# Patient Record
Sex: Female | Born: 1970 | Race: Black or African American | Hispanic: No | Smoking: Current every day smoker
Health system: Southern US, Community
[De-identification: ages and names within clinical notes are randomized; demographics above are authoritative.]

## PROBLEM LIST (undated history)

## (undated) DIAGNOSIS — R7303 Prediabetes: Secondary | ICD-10-CM

## (undated) DIAGNOSIS — F419 Anxiety disorder, unspecified: Secondary | ICD-10-CM

## (undated) DIAGNOSIS — F32A Depression, unspecified: Secondary | ICD-10-CM

## (undated) DIAGNOSIS — K760 Fatty (change of) liver, not elsewhere classified: Secondary | ICD-10-CM

## (undated) DIAGNOSIS — E785 Hyperlipidemia, unspecified: Secondary | ICD-10-CM

## (undated) DIAGNOSIS — D649 Anemia, unspecified: Secondary | ICD-10-CM

## (undated) HISTORY — DX: Depression, unspecified: F32.A

## (undated) HISTORY — DX: Prediabetes: R73.03

## (undated) HISTORY — DX: Anxiety disorder, unspecified: F41.9

## (undated) HISTORY — PX: MULTIPLE TOOTH EXTRACTIONS: SHX2053

## (undated) HISTORY — DX: Fatty (change of) liver, not elsewhere classified: K76.0

## (undated) HISTORY — DX: Hyperlipidemia, unspecified: E78.5

## (undated) HISTORY — PX: OTHER SURGICAL HISTORY: SHX169

---

## 2003-01-22 ENCOUNTER — Emergency Department (HOSPITAL_COMMUNITY): Admission: EM | Admit: 2003-01-22 | Discharge: 2003-01-22 | Payer: Self-pay | Admitting: Emergency Medicine

## 2003-01-23 ENCOUNTER — Encounter: Payer: Self-pay | Admitting: Emergency Medicine

## 2003-08-11 ENCOUNTER — Emergency Department (HOSPITAL_COMMUNITY): Admission: EM | Admit: 2003-08-11 | Discharge: 2003-08-11 | Payer: Self-pay | Admitting: Emergency Medicine

## 2004-01-21 ENCOUNTER — Emergency Department (HOSPITAL_COMMUNITY): Admission: EM | Admit: 2004-01-21 | Discharge: 2004-01-21 | Payer: Self-pay | Admitting: Emergency Medicine

## 2004-02-08 ENCOUNTER — Other Ambulatory Visit: Admission: RE | Admit: 2004-02-08 | Discharge: 2004-02-08 | Payer: Self-pay | Admitting: Obstetrics and Gynecology

## 2004-03-25 ENCOUNTER — Inpatient Hospital Stay (HOSPITAL_COMMUNITY): Admission: AD | Admit: 2004-03-25 | Discharge: 2004-03-26 | Payer: Self-pay | Admitting: Obstetrics and Gynecology

## 2004-06-07 ENCOUNTER — Inpatient Hospital Stay (HOSPITAL_COMMUNITY): Admission: AD | Admit: 2004-06-07 | Discharge: 2004-06-10 | Payer: Self-pay | Admitting: Obstetrics and Gynecology

## 2004-12-30 ENCOUNTER — Emergency Department (HOSPITAL_COMMUNITY): Admission: EM | Admit: 2004-12-30 | Discharge: 2004-12-30 | Payer: Self-pay | Admitting: Emergency Medicine

## 2005-01-07 ENCOUNTER — Encounter (INDEPENDENT_AMBULATORY_CARE_PROVIDER_SITE_OTHER): Payer: Self-pay | Admitting: Specialist

## 2005-01-07 ENCOUNTER — Other Ambulatory Visit: Admission: RE | Admit: 2005-01-07 | Discharge: 2005-01-07 | Payer: Self-pay | Admitting: Diagnostic Radiology

## 2005-01-07 ENCOUNTER — Encounter: Admission: RE | Admit: 2005-01-07 | Discharge: 2005-01-07 | Payer: Self-pay | Admitting: Obstetrics and Gynecology

## 2005-01-21 ENCOUNTER — Encounter: Admission: RE | Admit: 2005-01-21 | Discharge: 2005-01-21 | Payer: Self-pay | Admitting: Obstetrics and Gynecology

## 2005-07-12 ENCOUNTER — Emergency Department (HOSPITAL_COMMUNITY): Admission: EM | Admit: 2005-07-12 | Discharge: 2005-07-12 | Payer: Self-pay | Admitting: *Deleted

## 2005-11-11 ENCOUNTER — Inpatient Hospital Stay (HOSPITAL_COMMUNITY): Admission: AD | Admit: 2005-11-11 | Discharge: 2005-11-11 | Payer: Self-pay | Admitting: Obstetrics and Gynecology

## 2006-05-17 ENCOUNTER — Emergency Department (HOSPITAL_COMMUNITY): Admission: EM | Admit: 2006-05-17 | Discharge: 2006-05-17 | Payer: Self-pay | Admitting: Emergency Medicine

## 2008-05-08 ENCOUNTER — Emergency Department (HOSPITAL_COMMUNITY): Admission: EM | Admit: 2008-05-08 | Discharge: 2008-05-08 | Payer: Self-pay | Admitting: Emergency Medicine

## 2008-08-21 ENCOUNTER — Emergency Department (HOSPITAL_COMMUNITY): Admission: EM | Admit: 2008-08-21 | Discharge: 2008-08-21 | Payer: Self-pay | Admitting: Family Medicine

## 2010-02-27 ENCOUNTER — Emergency Department (HOSPITAL_COMMUNITY): Admission: EM | Admit: 2010-02-27 | Discharge: 2010-02-27 | Payer: Self-pay | Admitting: Emergency Medicine

## 2010-11-16 LAB — POCT CARDIAC MARKERS
CKMB, poc: <1 ng/mL — ABNORMAL LOW (ref 1.0–8.0)
CKMB, poc: <1 ng/mL — ABNORMAL LOW (ref 1.0–8.0)
Myoglobin, poc: 41 ng/mL (ref 12–200)
Troponin i, poc: <0.05 ng/mL (ref 0.00–0.09)

## 2010-11-16 LAB — POCT I-STAT, CHEM 8
Creatinine, Ser: 0.8 mg/dL (ref 0.4–1.2)
Hemoglobin: 13.9 g/dL (ref 12.0–15.0)
Potassium: 4.1 mEq/L (ref 3.5–5.1)
Sodium: 140 mEq/L (ref 135–145)
TCO2: 26 mmol/L (ref 0–100)

## 2010-11-16 LAB — DIFFERENTIAL
Basophils Relative: 1 % (ref 0–1)
Eosinophils Absolute: 0 10*3/uL (ref 0.0–0.7)
Eosinophils Relative: 1 % (ref 0–5)
Lymphocytes Relative: 38 % (ref 12–46)
Monocytes Absolute: 0.4 10*3/uL (ref 0.1–1.0)
Neutrophils Relative %: 52 % (ref 43–77)

## 2010-11-16 LAB — CBC
Hemoglobin: 13.3 g/dL (ref 12.0–15.0)
MCH: 32.9 pg (ref 26.0–34.0)
MCHC: 34.2 g/dL (ref 30.0–36.0)
Platelets: 183 10*3/uL (ref 150–400)
RBC: 4.06 MIL/uL (ref 3.87–5.11)

## 2010-11-16 LAB — URINALYSIS, ROUTINE W REFLEX MICROSCOPIC
Bilirubin Urine: NEGATIVE
Glucose, UA: NEGATIVE mg/dL
Hgb urine dipstick: NEGATIVE
Ketones, ur: NEGATIVE mg/dL
Protein, ur: NEGATIVE mg/dL
pH: 7.5 (ref 5.0–8.0)

## 2010-11-16 LAB — D-DIMER, QUANTITATIVE: D-Dimer, Quant: <0.22 ug/mL-FEU (ref 0.00–0.48)

## 2011-01-16 NOTE — Discharge Summary (Signed)
[name], [name] NO.:  [id number]   MEDICAL RECORD NO.:  [id number]          PATIENT TYPE:  INP   LOCATION:  9120                          FACILITY:  [facility]   PHYSICIAN:  Malachi Pro. Ambrose Mantle, M.D. DATE OF BIRTH:  1971/03/31   DATE OF ADMISSION:  06/07/2004  DATE OF DISCHARGE:                                 DISCHARGE SUMMARY   HOSPITAL COURSE:  This is a 40 year old black female para 2-0-2-2 gravida 5  admitted with premature rupture of the membranes.  Blood group and type O  positive with a negative antibody, sickle cell negative, RPR nonreactive,  rubella immune, hepatitis B surface antigen negative, HIV declined, GC and  chlamydia negative, 1-hour Glucola 104, group B strep positive.  Ultrasound  on November 26, 2003:  Crown-rump length 3.58 cm; 10 weeks 4 days; [facility] June 19, 2004.  Nuchal thickening was noted.  The patient was referred to Dr.  Sherrie George at the [facility].  A CVS was performed, showed normal  chromosomes, and echocardiogram of the baby was normal.  Prenatal care was  otherwise uncomplicated.  The patient began leaking clear fluid at  approximately 3 a.m. on the day of admission.  She came to maternity  admission where during speculum exam by me, clear fluid was seen gushing  through the cervix.  Past medical history:  No known allergies, no  operations.  The patient did have rheumatic fever as a child.  Alcohol,  tobacco, and drugs:  None.  Family history:  Negative.  Obstetric history:  In February 1994 she delivered a 7-pound 1-ounce female vaginally.  In  November 1995, a 7-pound 1.5-ounce female vaginally.  In 2002 and 2003 she had  spontaneous abortions.  Physical exam on admission revealed normal vital  signs. Heart was normal with normal sinus rhythm and no murmurs.  Lungs were  clear to auscultation.  Abdomen soft, fundal height palpated near term.  Fetal heart tones were normal.  Clear fluid was in the vagina.  The cervix  was a  tight fingertip dilated, 30% effaced, vertex at a -3.  Admitting  impression is intrauterine pregnancy at 38+ weeks, premature rupture of the  membranes, history of increased nuchal fold thickness with normal  chromosomes and no apparent abnormality on ultrasound exam.  Pitocin was  begun.  The patient got up to 10 mU/minute when fetal heart rate  decelerated.  The Pitocin was stopped.  The Pitocin was begun again at 5  mU/minute.  Fetal heart rate decelerated again.  Pitocin was stopped again.  I could not be certain that the fetal heart rate decelerations the nurse had  stopped the Pitocin for were actual decelerations or whether they might have  just been occasions where the fetal heart rate signal was lost and maternal  rate was traced.  Pitocin was begun again.  Contractions were every 3-4  minutes.  Cervix fingertip, 50%.  By 11:15 p.m. the Pitocin was at 22  mU/minute.  Contractions every 3 minutes.  Cervix loose fingertip, 60%.  At  12:50 a.m. the Pitocin was at 24 mU/minute.  Contractions every  2 minutes.  Cervix 3-4 cm, 90%, vertex at a -2.  The patient requested an epidural but  the anesthesia service for whatever reason was unable to respond for over an  hour.  By 2:05 a.m. the cervix was 8 cm, 100%.  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  The patient finally received her epidural and became fully  dilated.  Deep variable decelerations persisted, so with a silver dollar of  caput visible with contractions, a Kiwi vacuum was placed and the baby was  delivered LOA over a first degree perineal laceration.  The female infant was  6 pounds 5 ounces, Apgars of 9 at one and 9 at five minutes, and the baby  looked to be normal.  Placenta was intact, uterus was normal.  Perineal  laceration repaired with 3-0 Vicryl.  Blood loss about 600 mL.  Postpartum,  the patient did quite well and was discharged on postpartum day #2.  Attention will be directed to her urethral diverticulum at her  6 weeks  checkup and a possible urologic referral will be made.  Initial hemoglobin  was 9.2; hematocrit 27.1; white count 5100; platelet count 227,000.  Follow-  up hemoglobin 7.6, hematocrit 22.4.  RPR nonreactive.  The patient  demonstrated no signs of postural hypotension and on postpartum day #2 was  ready for discharge.   FINAL DIAGNOSES:  1.  Intrauterine pregnancy at 38+ weeks, delivered vertex.  2.  History of increased nuchal thickness in the fetus, resolved, and      apparently unassociated with any abnormalities.  3.  Positive group B streptococcus.  4.  Urethral diverticulum.   OPERATIONS:  1.  Kiwi vacuum assisted vaginal delivery.  2.  Repair of perineal laceration.   FINAL CONDITION:  Improved.   Instructions include our regular discharge instruction booklet.  The patient  declines analgesics at discharge and is advised to return to the office in 6  weeks for follow-up examination.      TFH/MEDQ  D:  06/10/2004  T:  06/10/2004  Job:  [id number]   cc:   Particia Nearing, M.D.  [facility]  [place], [place]

## 2011-06-05 LAB — CULTURE, FUNGUS WITHOUT SMEAR

## 2011-06-05 LAB — KOH PREP: KOH Prep: NONE SEEN

## 2011-08-20 ENCOUNTER — Encounter: Payer: Self-pay | Admitting: *Deleted

## 2011-08-20 ENCOUNTER — Other Ambulatory Visit: Payer: Self-pay

## 2011-08-20 ENCOUNTER — Emergency Department (HOSPITAL_COMMUNITY)
Admission: EM | Admit: 2011-08-20 | Discharge: 2011-08-20 | Disposition: A | Discharge status: Home / Self Care | Payer: Self-pay | Attending: Emergency Medicine | Admitting: Emergency Medicine

## 2011-08-20 ENCOUNTER — Emergency Department (HOSPITAL_COMMUNITY): Payer: Self-pay

## 2011-08-20 DIAGNOSIS — R112 Nausea with vomiting, unspecified: Secondary | ICD-10-CM

## 2011-08-20 DIAGNOSIS — R5381 Other malaise: Secondary | ICD-10-CM | POA: Insufficient documentation

## 2011-08-20 DIAGNOSIS — IMO0001 Reserved for inherently not codable concepts without codable children: Secondary | ICD-10-CM | POA: Insufficient documentation

## 2011-08-20 DIAGNOSIS — R6883 Chills (without fever): Secondary | ICD-10-CM | POA: Insufficient documentation

## 2011-08-20 DIAGNOSIS — M791 Myalgia, unspecified site: Secondary | ICD-10-CM

## 2011-08-20 DIAGNOSIS — Z7982 Long term (current) use of aspirin: Secondary | ICD-10-CM | POA: Insufficient documentation

## 2011-08-20 DIAGNOSIS — R109 Unspecified abdominal pain: Secondary | ICD-10-CM | POA: Insufficient documentation

## 2011-08-20 DIAGNOSIS — R5383 Other fatigue: Secondary | ICD-10-CM

## 2011-08-20 HISTORY — DX: Anemia, unspecified: D64.9

## 2011-08-20 LAB — CBC
HCT: 39.6 % (ref 36.0–46.0)
Hemoglobin: 13.6 g/dL (ref 12.0–15.0)
MCH: 31.7 pg (ref 26.0–34.0)
MCV: 92.3 fL (ref 78.0–100.0)
RBC: 4.29 MIL/uL (ref 3.87–5.11)

## 2011-08-20 LAB — POCT I-STAT, CHEM 8
BUN: 19 mg/dL (ref 6–23)
Calcium, Ion: 1.24 mmol/L (ref 1.12–1.32)
Chloride: 104 mEq/L (ref 96–112)
Creatinine, Ser: 1 mg/dL (ref 0.50–1.10)
Sodium: 142 mEq/L (ref 135–145)

## 2011-08-20 LAB — DIFFERENTIAL
Eosinophils Absolute: 0.1 10*3/uL (ref 0.0–0.7)
Lymphs Abs: 2.6 10*3/uL (ref 0.7–4.0)
Monocytes Absolute: 0.7 10*3/uL (ref 0.1–1.0)
Monocytes Relative: 11 % (ref 3–12)
Neutrophils Relative %: 45 % (ref 43–77)

## 2011-08-20 LAB — POCT I-STAT TROPONIN I

## 2011-08-20 NOTE — ED Notes (Signed)
Pt remains in triage. No distress noted.  Pt sitting with family in the waiting room.  Ambulatory.

## 2011-08-20 NOTE — ED Notes (Signed)
Pt signed AMA form per PA's request due to not wanting to stay for repeat cardiac emzymes.  Pt st's she will follow up with her MD.

## 2011-08-20 NOTE — ED Provider Notes (Signed)
History     CSN: [id number]  Arrival date & time 08/20/11  1605   First MD Initiated Contact with Patient 08/20/11 2000      Chief Complaint  Patient presents with  . Nausea  . Generalized Body Aches     HPI  History provided by the patient. Patient is a 40 year old female with past history of some anemia who presents today with complaints of generalized fatigue, nausea, body aches with one episode of vomiting that began over the past 2-3 days. Today patient states that she also feels a fullness or lump in her throat and chest area with swallowing. She states that she's had some dry heaves with burning taste in the mouth.  Patient denies any alleviating or aggravating factors. Patient has tolerated by mouth fluids. patient denies any shortness of breath, diaphoresis, fever, chills, diarrhea, abdominal pain. She does report a family history of hypertension and coronary artery disease. Denies any family with early cardiac death. Patient has no other significant past medical history.    Past Medical History  Diagnosis Date  . Anemia     History reviewed. No pertinent past surgical history.  History reviewed. No pertinent family history.  History  Substance Use Topics  . Smoking status: Not on file  . Smokeless tobacco: Not on file  . Alcohol Use:     OB History    Grav Para Term Preterm Abortions TAB SAB Ect Mult Living                  Review of Systems  Constitutional: Positive for chills and fatigue. Negative for fever.  Respiratory: Negative for cough and shortness of breath.   Gastrointestinal: Positive for nausea and vomiting. Negative for abdominal pain and diarrhea.  Musculoskeletal: Positive for myalgias.  All other systems reviewed and are negative.    Allergies  Review of patient's allergies indicates no known allergies.  Home Medications   Current Outpatient Rx  Name Route Sig Dispense Refill  . ASPIRIN 325 MG PO TABS Oral Take 650 mg by mouth  daily.        BP 94/65  Pulse 84  Temp(Src) 98.4 F (36.9 C) (Oral)  Resp 16  SpO2 99%  Physical Exam  Nursing note and vitals reviewed. Constitutional: She is oriented to person, place, and time. She appears well-developed and well-nourished. No distress.  HENT:  Head: Normocephalic.  Mouth/Throat: Oropharynx is clear and moist.  Neck: Normal range of motion.  Cardiovascular: Normal rate, regular rhythm and normal heart sounds.   Pulmonary/Chest: Effort normal and breath sounds normal. She has no wheezes. She has no rales.  Abdominal: Soft. There is tenderness in the right upper quadrant and epigastric area. There is no rebound, no guarding, no CVA tenderness, no tenderness at McBurney's point and negative Murphy's sign.       Tenderness is mild  Musculoskeletal: She exhibits no edema and no tenderness.  Neurological: She is alert and oriented to person, place, and time.  Skin: Skin is warm. No rash noted.  Psychiatric: She has a normal mood and affect. Her behavior is normal.    ED Course  Procedures (including critical care time)  Labs Reviewed  POCT I-STAT, CHEM 8 - Abnormal; Notable for the following:    Glucose, Bld 112 (*)    All other components within normal limits  CBC  DIFFERENTIAL  POCT I-STAT TROPONIN I  I-STAT TROPONIN I  I-STAT, CHEM 8   Results for orders placed during the  hospital encounter of 08/20/11  CBC      Component Value Range   WBC 6.1  4.0 - 10.5 (K/uL)   RBC 4.29  3.87 - 5.11 (MIL/uL)   Hemoglobin 13.6  12.0 - 15.0 (g/dL)   HCT 16.1  09.6 - 04.5 (%)   MCV 92.3  78.0 - 100.0 (fL)   MCH 31.7  26.0 - 34.0 (pg)   MCHC 34.3  30.0 - 36.0 (g/dL)   RDW 40.9  81.1 - 91.4 (%)   Platelets 207  150 - 400 (K/uL)  DIFFERENTIAL      Component Value Range   Neutrophils Relative 45  43 - 77 (%)   Neutro Abs 2.7  1.7 - 7.7 (K/uL)   Lymphocytes Relative 43  12 - 46 (%)   Lymphs Abs 2.6  0.7 - 4.0 (K/uL)   Monocytes Relative 11  3 - 12 (%)   Monocytes  Absolute 0.7  0.1 - 1.0 (K/uL)   Eosinophils Relative 1  0 - 5 (%)   Eosinophils Absolute 0.1  0.0 - 0.7 (K/uL)   Basophils Relative 0  0 - 1 (%)   Basophils Absolute 0.0  0.0 - 0.1 (K/uL)  POCT I-STAT TROPONIN I      Component Value Range   Troponin i, poc 0.00  0.00 - 0.08 (ng/mL)   Comment 3           POCT I-STAT, CHEM 8      Component Value Range   Sodium 142  135 - 145 (mEq/L)   Potassium 3.8  3.5 - 5.1 (mEq/L)   Chloride 104  96 - 112 (mEq/L)   BUN 19  6 - 23 (mg/dL)   Creatinine, Ser 7.82  0.50 - 1.10 (mg/dL)   Glucose, Bld 956 (*) 70 - 99 (mg/dL)   Calcium, Ion 2.13  0.86 - 1.32 (mmol/L)   TCO2 29  0 - 100 (mmol/L)   Hemoglobin 14.3  12.0 - 15.0 (g/dL)   HCT 57.8  46.9 - 62.9 (%)     Dg Chest 2 View  08/20/2011  *RADIOLOGY REPORT*  Clinical Data: Chest pain, shortness of breath  CHEST - 2 VIEW  Comparison: 02/27/2010  Findings: Normal heart size, mediastinal contours, and pulmonary vascularity. Lungs clear. No pleural effusion or pneumothorax. Bones unremarkable.  IMPRESSION: No acute abnormalities.  Original Report Authenticated By: Lollie Marrow, M.D.     1. Nausea & vomiting   2. Myalgia   3. Fatigue       MDM  8:00 PM patient seen and evaluated. Patient in no acute distress. Patient states that she's ready to return home. she states that she is articulate long time and had all her tests.  Patient's symptoms seem atypical for ACS. Patient has risk factors of smoking and some family history for hypertension and coronary artery disease. I recommended the patient a three-hour cardiac check and she has refused further testing. Patient states she wishes to followup with her primary care provider. Patient's symptoms most likely reflect a viral process however she was warned of alternative concerning causes. Patient is PERC negative. No recent long travel, no history of recent surgery, no estrogen use for birth control, no hemoptysis, no shortness of breath, no history of  cancer.    Date: 08/20/2011  Rate: 82   Rhythm: normal sinus rhythm  QRS Axis: normal  Intervals: normal  ST/T Wave abnormalities: normal  Conduction Disutrbances:none  Narrative Interpretation: Possible Anterior infarct, age undetermined  Old  EKG Reviewed: unchanged from 02/27/2010         Angus Seller, PA 08/20/11 2047

## 2011-08-20 NOTE — ED Notes (Signed)
To ed for eval of nausea and body aches for the past few days. States she feels like she has 'food caught in my chest'. Pain with breathing. Nothing makes pain worse or better.

## 2011-08-23 NOTE — ED Provider Notes (Signed)
Medical screening examination/treatment/procedure(s) were performed by non-physician practitioner and as supervising physician I was immediately available for consultation/collaboration.   Dione Booze, MD 08/23/11 [phone]

## 2012-07-07 ENCOUNTER — Emergency Department (HOSPITAL_COMMUNITY)
Admission: EM | Admit: 2012-07-07 | Discharge: 2012-07-07 | Disposition: A | Discharge status: Home / Self Care | Payer: Self-pay | Attending: Emergency Medicine | Admitting: Emergency Medicine

## 2012-07-07 DIAGNOSIS — R21 Rash and other nonspecific skin eruption: Secondary | ICD-10-CM | POA: Insufficient documentation

## 2012-07-07 DIAGNOSIS — Z862 Personal history of diseases of the blood and blood-forming organs and certain disorders involving the immune mechanism: Secondary | ICD-10-CM | POA: Insufficient documentation

## 2012-07-07 MED ORDER — CEPHALEXIN 500 MG PO CAPS: 500.0000 mg | ORAL_CAPSULE | Freq: Four times a day (QID) | ORAL | Status: DC

## 2012-07-07 NOTE — Progress Notes (Signed)
Pt listed as self pay with no insurance coverage Pt confirms She is self pay [place] resident.  CM and [facility] coordinator spoke with her Pt offered [facility] services to assist with finding a [place] pay provider and health reform information Pt states she has insurance and see a provider " wayne smith" CM unable to locate pcp in provider list [facility] files indicates pt rolled off medicaid in 2012

## 2012-07-07 NOTE — ED Notes (Signed)
Pt states she has a rash from some jewelry. Pt states she scratched area and now it's infected. Pt has red excoriated area to L side of neck. Pt also has crusted over wound on L ear lobe. Pt states she has treated areas with anti-fungal creams and then bacitracin. Pt states she believes areas are infected. Pt states areas are very itchy. Pt with no acute distress.

## 2012-07-07 NOTE — ED Provider Notes (Signed)
History     CSN: [id number]  Arrival date & time 07/07/12  1439   First MD Initiated Contact with Patient 07/07/12 1539      Chief Complaint  Patient presents with  . Rash    (Consider location/radiation/quality/duration/timing/severity/associated sxs/prior treatment) Patient is a 41 y.o. female presenting with rash. The history is provided by the patient.  Rash  This is a new problem. Episode onset: 1 week ago  The problem has been gradually worsening. The problem is associated with an unknown factor. There has been no fever. Affected Location: left neck & left inferior lobe. The pain is at a severity of 0/10. The patient is experiencing no pain. Associated symptoms include itching. Pertinent negatives include no blisters and no pain. She has tried antibiotic cream, antihistamines and a cold compress for the symptoms. The treatment provided mild relief. Risk factors include new medications.    Past Medical History  Diagnosis Date  . Anemia     No past surgical history on file.  No family history on file.  History  Substance Use Topics  . Smoking status: Not on file  . Smokeless tobacco: Not on file  . Alcohol Use:     OB History    Grav Para Term Preterm Abortions TAB SAB Ect Mult Living                  Review of Systems  Constitutional: Negative for fever, diaphoresis and activity change.  HENT: Negative for congestion and neck pain.   Respiratory: Negative for cough.   Genitourinary: Negative for dysuria.  Musculoskeletal: Negative for myalgias.  Skin: Positive for itching and rash. Negative for color change and wound.  Neurological: Negative for headaches.  All other systems reviewed and are negative.    Allergies  Review of patient's allergies indicates no known allergies.  Home Medications  No current outpatient prescriptions on file.  BP 110/67  Pulse 87  Temp 98.3 F (36.8 C) (Oral)  Resp 16  SpO2 97%  Physical Exam  Nursing note and vitals  reviewed. Constitutional: She is oriented to person, place, and time. She appears well-developed and well-nourished. No distress.  HENT:  Head: Normocephalic and atraumatic.       No mastoid erythema, tragal tenderness or spread of rash to ear canal. TM normal.   Eyes: Conjunctivae normal and EOM are normal.  Neck: Normal range of motion.  Pulmonary/Chest: Effort normal.  Musculoskeletal: Normal range of motion.  Neurological: She is alert and oriented to person, place, and time.  Skin: Skin is warm and dry. Rash noted. She is not diaphoretic.       Left neckline crease with raw erythematous. Anterior and posterior ear lobe with crusting and flaking. No target lesions, weeping, draining, vesicles.   Psychiatric: She has a normal mood and affect. Her behavior is normal.    ED Course  Procedures (including critical care time)  Labs Reviewed - No data to display No results found.   No diagnosis found.    MDM  Rash  Patient presents to ER with complaint of rash onset approximately a week and a half ago.  No history of eczema or psoriasis.  Left neck crease appears raw and left earlobe with crusting.  No improvement with antifungal creams or bacitracin.  Other than pruritus there are no symptoms including pain or fever.  Discussed need to followup with dermatology.  Will treat with oral antibiotic.  Patient seen with Dr. Juleen China who agrees with this  plan.    Jaci Carrel, PA-C 07/07/12 1635

## 2012-07-13 NOTE — ED Provider Notes (Signed)
Medical screening examination/treatment/procedure(s) were performed by non-physician practitioner and as supervising physician I was immediately available for consultation/collaboration.  Johnice Riebe, MD 07/13/12 0052 

## 2012-07-18 ENCOUNTER — Encounter (HOSPITAL_COMMUNITY): Payer: Self-pay | Admitting: *Deleted

## 2012-07-18 ENCOUNTER — Emergency Department (HOSPITAL_COMMUNITY)
Admission: EM | Admit: 2012-07-18 | Discharge: 2012-07-18 | Disposition: A | Discharge status: Home / Self Care | Payer: Self-pay | Attending: Emergency Medicine | Admitting: Emergency Medicine

## 2012-07-18 DIAGNOSIS — Z862 Personal history of diseases of the blood and blood-forming organs and certain disorders involving the immune mechanism: Secondary | ICD-10-CM | POA: Insufficient documentation

## 2012-07-18 DIAGNOSIS — L259 Unspecified contact dermatitis, unspecified cause: Secondary | ICD-10-CM | POA: Insufficient documentation

## 2012-07-18 DIAGNOSIS — F17200 Nicotine dependence, unspecified, uncomplicated: Secondary | ICD-10-CM | POA: Insufficient documentation

## 2012-07-18 MED ORDER — DEXAMETHASONE SODIUM PHOSPHATE 10 MG/ML IJ SOLN
10.0000 mg | Freq: Once | INTRAMUSCULAR | Status: AC
  Administered 2012-07-18: 10 mg via INTRAMUSCULAR
  Filled 2012-07-18: qty 1

## 2012-07-18 MED ORDER — PREDNISONE 50 MG PO TABS: 50.0000 mg | ORAL_TABLET | Freq: Every day | ORAL | Status: DC

## 2012-07-18 MED ORDER — HYDROXYZINE HCL 25 MG PO TABS: 25.0000 mg | ORAL_TABLET | Freq: Four times a day (QID) | ORAL | Status: DC

## 2012-07-18 NOTE — Progress Notes (Signed)
[facility] ED Cm noted no pcp listed Pt confirms pcp is [name] Updated EPIC

## 2012-07-18 NOTE — ED Notes (Signed)
Pt states started having a rash on the L side of her neck, small size, came to ED d/t it weeping fluid, was given Keflex, pt states it is not helping, rash has gotten bigger, covers L side of neck, pt states very itchy.

## 2012-07-18 NOTE — ED Provider Notes (Signed)
History     CSN: [id number]  Arrival date & time 07/18/12  1653   First MD Initiated Contact with Patient 07/18/12 1713      No chief complaint on file.   (Consider location/radiation/quality/duration/timing/severity/associated sxs/prior treatment) HPI The patient presents with rash to her L lateral neck and L earlobe. The patient was seen here previously for similar rash in the last week. The patient states that the Keflex did not seem to help the area. The place on her neck seems to be getting worse. The patient states that there is an extreme amount of itching associated. The patient states that she used Hydrocortisone cream on the area as well but seems to not be helping. The patient denies fever, weakness, Nausea, vomiting, headache, or visual changes.  Past Medical History  Diagnosis Date  . Anemia     History reviewed. No pertinent past surgical history.  History reviewed. No pertinent family history.  History  Substance Use Topics  . Smoking status: Current Every Day Smoker  . Smokeless tobacco: Never Used  . Alcohol Use: Yes    OB History    Grav Para Term Preterm Abortions TAB SAB Ect Mult Living                  Review of Systems All other systems negative except as documented in the HPI. All pertinent positives and negatives as reviewed in the HPI.   Allergies  Review of patient's allergies indicates no known allergies.  Home Medications   Current Outpatient Rx  Name  Route  Sig  Dispense  Refill  . CEPHALEXIN 500 MG PO CAPS   Oral   Take 500 mg by mouth 4 (four) times daily.           BP 116/74  Pulse 94  Temp 98.7 F (37.1 C) (Oral)  Resp 12  SpO2 98%  Physical Exam  Nursing note and vitals reviewed. Constitutional: She is oriented to person, place, and time. She appears well-developed and well-nourished. No distress.  HENT:  Head: Normocephalic and atraumatic.  Mouth/Throat: Oropharynx is clear and moist.  Neurological: She is alert  and oriented to person, place, and time.  Skin: Skin is warm and dry. Rash noted.       ED Course  Procedures (including critical care time)  Patient will be referred to dermatology and given steroids along with Benadryl.  Told to return here as needed.  MDM         Carlyle Dolly, PA-C 07/18/12 1758

## 2012-07-18 NOTE — ED Notes (Signed)
Pt complains of rash on her neck for 2 weeks. But antibiotics did dry it up but it is extremely itchy. Now pt states it is starting to weep. Pt said it started on her ear and has spread to a larger area on her neck.

## 2012-07-18 NOTE — ED Provider Notes (Signed)
Medical screening examination/treatment/procedure(s) were performed by non-physician practitioner and as supervising physician I was immediately available for consultation/collaboration.   Celene Kras, MD 07/18/12 [phone]

## 2012-09-13 ENCOUNTER — Emergency Department (HOSPITAL_BASED_OUTPATIENT_CLINIC_OR_DEPARTMENT_OTHER)
Admission: EM | Admit: 2012-09-13 | Discharge: 2012-09-13 | Disposition: A | Discharge status: Home / Self Care | Payer: Self-pay | Attending: Emergency Medicine | Admitting: Emergency Medicine

## 2012-09-13 ENCOUNTER — Encounter (HOSPITAL_BASED_OUTPATIENT_CLINIC_OR_DEPARTMENT_OTHER): Payer: Self-pay

## 2012-09-13 ENCOUNTER — Emergency Department (HOSPITAL_COMMUNITY)
Admission: EM | Admit: 2012-09-13 | Discharge: 2012-09-13 | Disposition: A | Discharge status: Home / Self Care | Payer: Self-pay

## 2012-09-13 DIAGNOSIS — F17200 Nicotine dependence, unspecified, uncomplicated: Secondary | ICD-10-CM | POA: Insufficient documentation

## 2012-09-13 DIAGNOSIS — R21 Rash and other nonspecific skin eruption: Secondary | ICD-10-CM | POA: Insufficient documentation

## 2012-09-13 DIAGNOSIS — Z862 Personal history of diseases of the blood and blood-forming organs and certain disorders involving the immune mechanism: Secondary | ICD-10-CM | POA: Insufficient documentation

## 2012-09-13 MED ORDER — PREDNISONE 10 MG PO TABS: ORAL_TABLET | ORAL | Status: DC

## 2012-09-13 NOTE — ED Provider Notes (Signed)
History     CSN: [id number]  Arrival date & time 09/13/12  7829   First MD Initiated Contact with Patient 09/13/12 1926      Chief Complaint  Patient presents with  . Rash    (Consider location/radiation/quality/duration/timing/severity/associated sxs/prior treatment) HPI Comments: Pt states that she has had the rash intermittently over the last couple of months:pt states that when she took 6 days of steroids it completely cleared up by now it has come back:pt denies any new exposure  Patient is a 42 y.o. female presenting with rash. The history is provided by the patient. No language interpreter was used.  Rash  This is a recurrent problem. The current episode started more than 1 week ago. The problem has been gradually worsening. The problem is associated with an unknown factor. There has been no fever. The rash is present on the neck. The patient is experiencing no pain. She has tried steriods for the symptoms. The treatment provided significant relief.    Past Medical History  Diagnosis Date  . Anemia     History reviewed. No pertinent past surgical history.  No family history on file.  History  Substance Use Topics  . Smoking status: Current Every Day Smoker  . Smokeless tobacco: Never Used  . Alcohol Use: Yes     Comment: occasional    OB History    Grav Para Term Preterm Abortions TAB SAB Ect Mult Living                  Review of Systems  Constitutional: Negative.   Respiratory: Negative.   Cardiovascular: Negative.   Skin: Positive for rash.    Allergies  Review of patient's allergies indicates no known allergies.  Home Medications   Current Outpatient Rx  Name  Route  Sig  Dispense  Refill  . PREDNISONE 10 MG PO TABS      6 tablets po day 1-2,5 tablets po day 3-4, 4 tablets po day 5-6, 3 tablet po day 7-8, 2 tablet day 9-10   40 tablet   0     BP 102/79  Pulse 87  Temp 98.2 F (36.8 C) (Oral)  Resp 16  Ht 5\' 5"  (1.651 m)  Wt 145 lb  (65.772 kg)  BMI 24.13 kg/m2  SpO2 100%  Physical Exam  Nursing note and vitals reviewed. Constitutional: She is oriented to person, place, and time. She appears well-developed and well-nourished.  Cardiovascular: Normal rate and regular rhythm.   Pulmonary/Chest: Effort normal and breath sounds normal.  Neurological: She is alert and oriented to person, place, and time.  Skin:       Pt has dried scaly area to the left neck and ear:no drainage noted from the area    ED Course  Procedures (including critical care time)  Labs Reviewed - No data to display No results found.   1. Rash       MDM  Discussed follow up with derm:treated with steroids:pt is also requesting referral to pcp        Teressa Lower, NP 09/13/12 2137

## 2012-09-13 NOTE — ED Notes (Signed)
NP at bedside.

## 2012-09-13 NOTE — ED Notes (Signed)
Rash on neck and face x 2 months ago.  She was at [facility], states wait was to long and she was told by staff to come to [facility].

## 2012-09-13 NOTE — ED Notes (Signed)
Pt left to go to [facility]

## 2012-09-14 NOTE — ED Provider Notes (Signed)
Medical screening examination/treatment/procedure(s) were performed by non-physician practitioner and as supervising physician I was immediately available for consultation/collaboration.   Carleene Cooper III, MD 09/14/12 [phone]

## 2013-03-27 ENCOUNTER — Emergency Department (HOSPITAL_BASED_OUTPATIENT_CLINIC_OR_DEPARTMENT_OTHER): Payer: BC Managed Care – PPO

## 2013-03-27 ENCOUNTER — Emergency Department (HOSPITAL_BASED_OUTPATIENT_CLINIC_OR_DEPARTMENT_OTHER)
Admission: EM | Admit: 2013-03-27 | Discharge: 2013-03-28 | Disposition: A | Discharge status: Home / Self Care | Payer: BC Managed Care – PPO | Attending: Emergency Medicine | Admitting: Emergency Medicine

## 2013-03-27 ENCOUNTER — Encounter (HOSPITAL_BASED_OUTPATIENT_CLINIC_OR_DEPARTMENT_OTHER): Payer: Self-pay | Admitting: *Deleted

## 2013-03-27 ENCOUNTER — Encounter (HOSPITAL_COMMUNITY): Payer: Self-pay | Admitting: *Deleted

## 2013-03-27 ENCOUNTER — Emergency Department (HOSPITAL_COMMUNITY)
Admission: EM | Admit: 2013-03-27 | Discharge: 2013-03-27 | Discharge status: Against Medical Advice | Payer: BC Managed Care – PPO | Attending: Emergency Medicine | Admitting: Emergency Medicine

## 2013-03-27 DIAGNOSIS — K2970 Gastritis, unspecified, without bleeding: Secondary | ICD-10-CM | POA: Insufficient documentation

## 2013-03-27 DIAGNOSIS — F17200 Nicotine dependence, unspecified, uncomplicated: Secondary | ICD-10-CM | POA: Insufficient documentation

## 2013-03-27 DIAGNOSIS — K2990 Gastroduodenitis, unspecified, without bleeding: Secondary | ICD-10-CM | POA: Insufficient documentation

## 2013-03-27 DIAGNOSIS — D649 Anemia, unspecified: Secondary | ICD-10-CM | POA: Insufficient documentation

## 2013-03-27 DIAGNOSIS — R638 Other symptoms and signs concerning food and fluid intake: Secondary | ICD-10-CM | POA: Insufficient documentation

## 2013-03-27 DIAGNOSIS — R21 Rash and other nonspecific skin eruption: Secondary | ICD-10-CM | POA: Insufficient documentation

## 2013-03-27 DIAGNOSIS — R109 Unspecified abdominal pain: Secondary | ICD-10-CM | POA: Insufficient documentation

## 2013-03-27 DIAGNOSIS — Z862 Personal history of diseases of the blood and blood-forming organs and certain disorders involving the immune mechanism: Secondary | ICD-10-CM | POA: Insufficient documentation

## 2013-03-27 DIAGNOSIS — R112 Nausea with vomiting, unspecified: Secondary | ICD-10-CM | POA: Insufficient documentation

## 2013-03-27 DIAGNOSIS — Z3202 Encounter for pregnancy test, result negative: Secondary | ICD-10-CM | POA: Insufficient documentation

## 2013-03-27 DIAGNOSIS — R1110 Vomiting, unspecified: Secondary | ICD-10-CM | POA: Insufficient documentation

## 2013-03-27 LAB — CBC WITH DIFFERENTIAL/PLATELET
Eosinophils Relative: 1 % (ref 0–5)
HCT: 40.9 % (ref 36.0–46.0)
Lymphocytes Relative: 40 % (ref 12–46)
Lymphs Abs: 2.6 10*3/uL (ref 0.7–4.0)
MCV: 89.9 fL (ref 78.0–100.0)
Monocytes Absolute: 0.5 10*3/uL (ref 0.1–1.0)
RBC: 4.55 MIL/uL (ref 3.87–5.11)
WBC: 6.4 10*3/uL (ref 4.0–10.5)

## 2013-03-27 LAB — URINE MICROSCOPIC-ADD ON

## 2013-03-27 LAB — PREGNANCY, URINE: Preg Test, Ur: NEGATIVE

## 2013-03-27 LAB — URINALYSIS, ROUTINE W REFLEX MICROSCOPIC
Bilirubin Urine: NEGATIVE
Glucose, UA: NEGATIVE mg/dL
Hgb urine dipstick: NEGATIVE
Specific Gravity, Urine: 1.027 (ref 1.005–1.030)
pH: 6.5 (ref 5.0–8.0)

## 2013-03-27 MED ORDER — DICYCLOMINE HCL 10 MG/ML IM SOLN
20.0000 mg | Freq: Once | INTRAMUSCULAR | Status: AC
  Administered 2013-03-27: 20 mg via INTRAMUSCULAR
  Filled 2013-03-27: qty 2

## 2013-03-27 MED ORDER — GI COCKTAIL ~~LOC~~
30.0000 mL | Freq: Once | ORAL | Status: AC
  Administered 2013-03-27: 30 mL via ORAL
  Filled 2013-03-27: qty 30

## 2013-03-27 NOTE — ED Notes (Signed)
This RN notified by registration that this pt choosing to leave and left.

## 2013-03-27 NOTE — ED Provider Notes (Signed)
CSN: [id number]     Arrival date & time 03/27/13  2230 History  This chart was scribed for [name] Smitty Cords, MD by Bennett Scrape, ED Scribe. This patient was seen in room MH09/MH09 and the patient's care was started at 11:34 PM.   First MD Initiated Contact with Patient 03/27/13 2324     Chief Complaint  Patient presents with  . Abdominal Pain    Patient is a 42 y.o. female presenting with abdominal pain. The history is provided by the patient. No language interpreter was used.  Abdominal Pain This is a new problem. The current episode started more than 2 days ago. The problem has not changed since onset.Associated symptoms include abdominal pain. Pertinent negatives include no chest pain. Nothing aggravates the symptoms. Nothing relieves the symptoms. Treatments tried: gas x, mylanta  The treatment provided mild relief.    HPI Comments: [name] is a 42 y.o. female who presents to the Emergency Department complaining of 3 days of intermittent LUQ abdominal pain described as a cramping sensation. She reports associated dry heaves and decreased appetite. She states that the symptoms originally felt like gas and she tried Gas x , Mylanta and "dipped my finger in baking soda" with mild improvement. She denies having a h/o GERD or feeling a burning, acid feeling in the posterior throat. She denies any recent falls, trauma or changes in exercise regimen. She denies diarrhea and urinary symptoms as associated symptoms.   Past Medical History  Diagnosis Date  . Anemia    History reviewed. No pertinent past surgical history. History reviewed. No pertinent family history. History  Substance Use Topics  . Smoking status: Current Every Day Smoker -- 1.00 packs/day    Types: Cigarettes  . Smokeless tobacco: Never Used  . Alcohol Use: Yes     Comment: occasional   No OB history provided.  Review of Systems  Constitutional: Positive for appetite change.  Cardiovascular:  Negative for chest pain.  Gastrointestinal: Positive for nausea, vomiting and abdominal pain. Negative for diarrhea.  Genitourinary: Negative for dysuria and urgency.  Skin: Positive for rash.  All other systems reviewed and are negative.    Allergies  Review of patient's allergies indicates no known allergies.  Home Medications   Current Outpatient Rx  Name  Route  Sig  Dispense  Refill  . predniSONE (DELTASONE) 10 MG tablet      6 tablets po day 1-2,5 tablets po day 3-4, 4 tablets po day 5-6, 3 tablet po day 7-8, 2 tablet day 9-10   40 tablet   0    Triage Vitals: BP 101/68  Pulse 92  Temp(Src) 98 F (36.7 C) (Oral)  Resp 16  Ht 5\' 4"  (1.626 m)  Wt 156 lb (70.761 kg)  BMI 26.76 kg/m2  SpO2 99%  LMP 03/20/2013  Physical Exam  Nursing note and vitals reviewed. Constitutional: She is oriented to person, place, and time. She appears well-developed and well-nourished. No distress.  HENT:  Head: Normocephalic and atraumatic.  Mouth/Throat: Oropharynx is clear and moist.  Eyes: Conjunctivae and EOM are normal. Pupils are equal, round, and reactive to light.  Sclera are clear  Neck: Normal range of motion. Neck supple. No tracheal deviation present.  Cardiovascular: Normal rate, regular rhythm, normal heart sounds and intact distal pulses.   No murmur heard. Pulmonary/Chest: Effort normal and breath sounds normal. No respiratory distress. She has no wheezes. She has no rales.  Abdominal: Soft. Bowel sounds are normal. She exhibits  no mass. There is no tenderness. There is no rebound and no guarding.  Gas pocket in the upper abdomen  Musculoskeletal: Normal range of motion. She exhibits no edema (no ankle swelling).  Lymphadenopathy:    She has no cervical adenopathy.  Neurological: She is alert and oriented to person, place, and time. No cranial nerve deficit.  Pt able to move both sets of fingers and toes  Skin: Skin is warm and dry. No rash (no hives ) noted.  No  erythroderma   Psychiatric: She has a normal mood and affect. Her behavior is normal.    ED Course   Procedures (including critical care time)  Medications  traMADol (ULTRAM) tablet 50 mg (not administered)  gi cocktail (Maalox,Lidocaine,Donnatal) (30 mLs Oral Given 03/27/13 2349)  dicyclomine (BENTYL) injection 20 mg (20 mg Intramuscular Given 03/27/13 2349)    DIAGNOSTIC STUDIES: Oxygen Saturation is 99% on room air, normal by my interpretation.    COORDINATION OF CARE: 11:37 PM-Advised pt to use ivory soap and benadryl to improve the rash. Discussed treatment plan which includes medications, x-ray of abdomen, CMP annd UA with pt at bedside and pt agreed to plan.   Labs Reviewed  URINALYSIS, ROUTINE W REFLEX MICROSCOPIC - Abnormal; Notable for the following:    Ketones, ur 15 (*)    Leukocytes, UA SMALL (*)    All other components within normal limits  COMPREHENSIVE METABOLIC PANEL - Abnormal; Notable for the following:    Glucose, Bld 114 (*)    Total Bilirubin 0.2 (*)    GFR calc non Af Amer 90 (*)    All other components within normal limits  URINE MICROSCOPIC-ADD ON - Abnormal; Notable for the following:    Squamous Epithelial / LPF FEW (*)    Bacteria, UA FEW (*)    All other components within normal limits  URINE CULTURE  PREGNANCY, URINE   Dg Abd Acute W/chest  03/28/2013   *RADIOLOGY REPORT*  Clinical Data: Pain.  Nausea for 3 days.  ACUTE ABDOMEN SERIES (ABDOMEN 2 VIEW & CHEST 1 VIEW)  Comparison: 08/20/2011.  Findings: Lungs clear.  Cardiopericardial silhouette within normal limits.  Trachea midline.  No airspace disease or effusion. Bowel gas pattern is within normal limits.  No pathologic air fluid levels are identified.   Stool and bowel gas present in the rectosigmoid.  IUD is present within the uterus.  Phleboliths noted.  IMPRESSION: No acute abnormality.  Normal bowel gas pattern.   Original Report Authenticated By: Andreas Newport, M.D.   1. Gastritis      MDM  Gastritis   I personally performed the services described in this documentation, which was scribed in my presence. The recorded information has been reviewed and is accurate.     Jasmine Awe, MD 03/28/13 [phone]

## 2013-03-27 NOTE — ED Notes (Signed)
Pt seen ay [facility] ED this pm labs drawn left AMA before seen by PMD , abd pain x 4 days

## 2013-03-27 NOTE — ED Notes (Signed)
Pt in c/o abd pain x2 days with vomiting, pt states pain is to upper abdomen and she feels like her abdomen is cramping when the pain comes on and pain radiates into her back

## 2013-03-28 LAB — COMPREHENSIVE METABOLIC PANEL
AST: 15 U/L (ref 0–37)
BUN: 12 mg/dL (ref 6–23)
CO2: 26 mEq/L (ref 19–32)
Calcium: 9.7 mg/dL (ref 8.4–10.5)
Chloride: 104 mEq/L (ref 96–112)
Creatinine, Ser: 0.8 mg/dL (ref 0.50–1.10)
GFR calc non Af Amer: 90 mL/min — ABNORMAL LOW (ref >90)
Total Bilirubin: 0.2 mg/dL — ABNORMAL LOW (ref 0.3–1.2)

## 2013-03-28 MED ORDER — TRAMADOL HCL 50 MG PO TABS
50.0000 mg | ORAL_TABLET | Freq: Once | ORAL | Status: DC
  Filled 2013-03-28: qty 1

## 2013-03-28 MED ORDER — TRAMADOL HCL 50 MG PO TABS: 50.0000 mg | ORAL_TABLET | Freq: Four times a day (QID) | ORAL | Status: DC | PRN

## 2013-03-28 MED ORDER — OMEPRAZOLE 20 MG PO CPDR: 20.0000 mg | DELAYED_RELEASE_CAPSULE | Freq: Every day | ORAL | Status: DC

## 2013-03-28 MED ORDER — SUCRALFATE 1 GM/10ML PO SUSP: 1.0000 g | Freq: Four times a day (QID) | ORAL | Status: DC

## 2013-03-29 LAB — URINE CULTURE: Colony Count: 15000

## 2014-02-07 ENCOUNTER — Encounter (HOSPITAL_COMMUNITY): Payer: Self-pay | Admitting: Emergency Medicine

## 2014-02-07 ENCOUNTER — Emergency Department (HOSPITAL_COMMUNITY)
Admission: EM | Admit: 2014-02-07 | Discharge: 2014-02-07 | Disposition: A | Discharge status: Home / Self Care | Payer: No Typology Code available for payment source | Attending: Emergency Medicine | Admitting: Emergency Medicine

## 2014-02-07 ENCOUNTER — Emergency Department (HOSPITAL_COMMUNITY): Payer: No Typology Code available for payment source

## 2014-02-07 DIAGNOSIS — F17200 Nicotine dependence, unspecified, uncomplicated: Secondary | ICD-10-CM | POA: Insufficient documentation

## 2014-02-07 DIAGNOSIS — R112 Nausea with vomiting, unspecified: Secondary | ICD-10-CM | POA: Insufficient documentation

## 2014-02-07 DIAGNOSIS — Z3202 Encounter for pregnancy test, result negative: Secondary | ICD-10-CM | POA: Insufficient documentation

## 2014-02-07 DIAGNOSIS — R0789 Other chest pain: Secondary | ICD-10-CM | POA: Insufficient documentation

## 2014-02-07 DIAGNOSIS — Z79899 Other long term (current) drug therapy: Secondary | ICD-10-CM | POA: Insufficient documentation

## 2014-02-07 DIAGNOSIS — D649 Anemia, unspecified: Secondary | ICD-10-CM | POA: Insufficient documentation

## 2014-02-07 LAB — I-STAT TROPONIN, ED: Troponin i, poc: 0.01 ng/mL (ref 0.00–0.08)

## 2014-02-07 LAB — BASIC METABOLIC PANEL
BUN: 10 mg/dL (ref 6–23)
CO2: 24 mEq/L (ref 19–32)
CREATININE: 0.59 mg/dL (ref 0.50–1.10)
Calcium: 9 mg/dL (ref 8.4–10.5)
Chloride: 103 mEq/L (ref 96–112)
Glucose, Bld: 90 mg/dL (ref 70–99)
Potassium: 4.2 mEq/L (ref 3.7–5.3)
Sodium: 139 mEq/L (ref 137–147)

## 2014-02-07 LAB — LIPASE, BLOOD: Lipase: 37 U/L (ref 11–59)

## 2014-02-07 LAB — HEPATIC FUNCTION PANEL
ALBUMIN: 3.8 g/dL (ref 3.5–5.2)
ALT: 15 U/L (ref 0–35)
AST: 15 U/L (ref 0–37)
Alkaline Phosphatase: 76 U/L (ref 39–117)
BILIRUBIN TOTAL: 0.2 mg/dL — AB (ref 0.3–1.2)
Total Protein: 7.5 g/dL (ref 6.0–8.3)

## 2014-02-07 LAB — CBC
HEMATOCRIT: 38.1 % (ref 36.0–46.0)
Hemoglobin: 13 g/dL (ref 12.0–15.0)
MCH: 31.5 pg (ref 26.0–34.0)
MCHC: 34.1 g/dL (ref 30.0–36.0)
MCV: 92.3 fL (ref 78.0–100.0)
Platelets: 180 10*3/uL (ref 150–400)
RBC: 4.13 MIL/uL (ref 3.87–5.11)
RDW: 14.5 % (ref 11.5–15.5)
WBC: 5.7 10*3/uL (ref 4.0–10.5)

## 2014-02-07 LAB — TROPONIN I
Troponin I: <0.3 ng/mL (ref <0.30)
Troponin I: <0.3 ng/mL (ref <0.30)

## 2014-02-07 LAB — POC URINE PREG, ED: PREG TEST UR: NEGATIVE

## 2014-02-07 LAB — D-DIMER, QUANTITATIVE: D-Dimer, Quant: 0.27 ug/mL-FEU (ref 0.00–0.48)

## 2014-02-07 MED ORDER — MORPHINE SULFATE 4 MG/ML IJ SOLN
4.0000 mg | Freq: Once | INTRAMUSCULAR | Status: DC
  Filled 2014-02-07: qty 1

## 2014-02-07 MED ORDER — IOHEXOL 350 MG/ML SOLN
100.0000 mL | Freq: Once | INTRAVENOUS | Status: AC | PRN
  Administered 2014-02-07: 100 mL via INTRAVENOUS

## 2014-02-07 MED ORDER — ASPIRIN 81 MG PO CHEW: 324.0000 mg | CHEWABLE_TABLET | Freq: Once | ORAL | Status: DC

## 2014-02-07 MED ORDER — KETOROLAC TROMETHAMINE 30 MG/ML IJ SOLN
30.0000 mg | Freq: Once | INTRAMUSCULAR | Status: AC
  Administered 2014-02-07: 30 mg via INTRAVENOUS
  Filled 2014-02-07: qty 1

## 2014-02-07 NOTE — Discharge Instructions (Signed)
Chest Pain (Nonspecific) There is no evidence of a heart attack or blood clot in the lung. Follow up with your doctor and the cardiology. Return to the ED if you develop new or worsening symptoms.  It is often hard to give a specific diagnosis for the cause of chest pain. There is always a chance that your pain could be related to something serious, such as a heart attack or a blood clot in the lungs. You need to follow up with your caregiver for further evaluation. CAUSES   Heartburn.  Pneumonia or bronchitis.  Anxiety or stress.  Inflammation around your heart (pericarditis) or lung (pleuritis or pleurisy).  A blood clot in the lung.  A collapsed lung (pneumothorax). It can develop suddenly on its own (spontaneous pneumothorax) or from injury (trauma) to the chest.  Shingles infection (herpes zoster virus). The chest wall is composed of bones, muscles, and cartilage. Any of these can be the source of the pain.  The bones can be bruised by injury.  The muscles or cartilage can be strained by coughing or overwork.  The cartilage can be affected by inflammation and become sore (costochondritis). DIAGNOSIS  Lab tests or other studies, such as X-rays, electrocardiography, stress testing, or cardiac imaging, may be needed to find the cause of your pain.  TREATMENT   Treatment depends on what may be causing your chest pain. Treatment may include:  Acid blockers for heartburn.  Anti-inflammatory medicine.  Pain medicine for inflammatory conditions.  Antibiotics if an infection is present.  You may be advised to change lifestyle habits. This includes stopping smoking and avoiding alcohol, caffeine, and chocolate.  You may be advised to keep your head raised (elevated) when sleeping. This reduces the chance of acid going backward from your stomach into your esophagus.  Most of the time, nonspecific chest pain will improve within 2 to 3 days with rest and mild pain medicine. HOME  CARE INSTRUCTIONS   If antibiotics were prescribed, take your antibiotics as directed. Finish them even if you start to feel better.  For the next few days, avoid physical activities that bring on chest pain. Continue physical activities as directed.  Do not smoke.  Avoid drinking alcohol.  Only take over-the-counter or prescription medicine for pain, discomfort, or fever as directed by your caregiver.  Follow your caregiver's suggestions for further testing if your chest pain does not go away.  Keep any follow-up appointments you made. If you do not go to an appointment, you could develop lasting (chronic) problems with pain. If there is any problem keeping an appointment, you must call to reschedule. SEEK MEDICAL CARE IF:   You think you are having problems from the medicine you are taking. Read your medicine instructions carefully.  Your chest pain does not go away, even after treatment.  You develop a rash with blisters on your chest. SEEK IMMEDIATE MEDICAL CARE IF:   You have increased chest pain or pain that spreads to your arm, neck, jaw, back, or abdomen.  You develop shortness of breath, an increasing cough, or you are coughing up blood.  You have severe back or abdominal pain, feel nauseous, or vomit.  You develop severe weakness, fainting, or chills.  You have a fever. THIS IS AN EMERGENCY. Do not wait to see if the pain will go away. Get medical help at once. Call your local emergency services (911 in [place] Do not drive yourself to the hospital. MAKE SURE YOU:   Understand these instructions.  Will watch your condition.  Will get help right away if you are not doing well or get worse. Document Released: 05/27/2005 Document Revised: 11/09/2011 Document Reviewed: 03/22/2008 [facility] Patient Information 2014 [place].

## 2014-02-07 NOTE — ED Provider Notes (Signed)
CSN: [id number]     Arrival date & time 02/07/14  1124 History   First MD Initiated Contact with Patient 02/07/14 1207     Chief Complaint  Patient presents with  . Chest Pain     (Consider location/radiation/quality/duration/timing/severity/associated sxs/prior Treatment) HPI Comments: Patient reports intermittent right-sided chest pain with nausea since this morning she woke up. The pain is sharp and stabbing on the right side of her chest lasting a few seconds at a time. She continues to have soreness in between the sharp episodes. The pain lasts about 30-45 seconds at a time and then improves but a constant "soreness" remains. Radiates to her right arm and right back. She denies any trauma or lifting injury. She denies any focal weakness, numbness or tingling. She is a previous smoker. She denies any cardiac or pulmonary history. She denies any medical problems. She's reports a negative stress test several years ago. She denies any leg pain or leg swelling. The pain is worse with certain movements and better with rest. Denies abdominal pain or vomiting.  The history is provided by the patient.    Past Medical History  Diagnosis Date  . Anemia    History reviewed. No pertinent past surgical history. History reviewed. No pertinent family history. History  Substance Use Topics  . Smoking status: Current Every Day Smoker -- 1.00 packs/day    Types: Cigarettes  . Smokeless tobacco: Never Used  . Alcohol Use: Yes     Comment: occasional   OB History   Grav Para Term Preterm Abortions TAB SAB Ect Mult Living                 Review of Systems  Constitutional: Negative for activity change.  HENT: Negative for congestion and rhinorrhea.   Respiratory: Negative for cough, chest tightness and shortness of breath.   Cardiovascular: Positive for chest pain.  Gastrointestinal: Positive for nausea. Negative for vomiting and abdominal pain.  Genitourinary: Negative for dysuria, hematuria,  vaginal bleeding and vaginal discharge.  Musculoskeletal: Negative for arthralgias, back pain and myalgias.  Skin: Negative for rash.  Neurological: Negative for dizziness, weakness and headaches.  A complete 10 system review of systems was obtained and all systems are negative except as noted in the HPI and PMH.      Allergies  Review of patient's allergies indicates no known allergies.  Home Medications   Prior to Admission medications   Medication Sig Start Date End Date Taking? Authorizing Provider  MELATONIN PO Take 1 tablet by mouth daily.   Yes Historical Provider, MD   BP 106/74  Pulse 75  Temp(Src) 98.2 F (36.8 C) (Oral)  Resp 15  SpO2 100% Physical Exam  Constitutional: She is oriented to person, place, and time. She appears well-developed. No distress.  HENT:  Head: Normocephalic and atraumatic.  Mouth/Throat: Oropharynx is clear and moist. No oropharyngeal exudate.  Eyes: Conjunctivae and EOM are normal. Pupils are equal, round, and reactive to light.  Neck: Normal range of motion. Neck supple.  Cardiovascular: Normal rate, regular rhythm, normal heart sounds and intact distal pulses.   No murmur heard. Pulmonary/Chest: Effort normal and breath sounds normal. No respiratory distress. She exhibits tenderness.  Right-sided chest tenderness that is worse with palpation or movement.  Abdominal: Soft. There is no tenderness. There is no rebound and no guarding.  Musculoskeletal: Normal range of motion. She exhibits no edema and no tenderness.  Neurological: She is alert and oriented to person, place, and time. No  cranial nerve deficit. She exhibits normal muscle tone. Coordination normal.  CN 2-12 intact, no ataxia on finger to nose, no nystagmus, 5/5 strength throughout, no pronator drift, Romberg negative, normal gait.   Skin: Skin is warm.    ED Course  Procedures (including critical care time) Labs Review Labs Reviewed  HEPATIC FUNCTION PANEL - Abnormal;  Notable for the following:    Total Bilirubin 0.2 (*)    All other components within normal limits  CBC  BASIC METABOLIC PANEL  TROPONIN I  D-DIMER, QUANTITATIVE  LIPASE, BLOOD  TROPONIN I  I-STAT TROPOININ, ED  POC URINE PREG, ED    Imaging Review Dg Chest 2 View  02/07/2014   CLINICAL DATA:  Chest pain  EXAM: CHEST  2 VIEW  COMPARISON:  03/28/2013  FINDINGS: The heart size and mediastinal contours are within normal limits. Both lungs are clear. The visualized skeletal structures are unremarkable.  IMPRESSION: No active cardiopulmonary disease.   Electronically Signed   By: Franchot Gallo M.D.   On: 02/07/2014 12:13   Ct Angio Chest Aortic Dissect W &/or W/o  02/07/2014   CLINICAL DATA:  Chest pain going through to back, question aortic dissection  EXAM: CT ANGIOGRAPHY CHEST, ABDOMEN AND PELVIS  TECHNIQUE: Multidetector CT imaging through the chest, abdomen and pelvis was performed using the standard protocol during bolus administration of intravenous contrast. Multiplanar reconstructed images and MIPs were obtained and reviewed to evaluate the vascular anatomy.  CONTRAST:  113mL OMNIPAQUE IOHEXOL 350 MG/ML SOLN IV; no oral contrast administered  COMPARISON:  None  FINDINGS: CTA CHEST FINDINGS  Pre contrast images demonstrate normal caliber thoracic aorta.  No intramural hematoma or high attenuation crescent seen within aortic wall.  Following contrast, normal aortic enhancement without aneurysm or dissection.  Pulmonary arteries patent without evidence of pulmonary embolism.  No thoracic adenopathy.  Lungs clear.  No infiltrate, pleural effusion, pneumothorax or acute osseous findings.  Review of the MIP images confirms the above findings.  CTA ABDOMEN AND PELVIS FINDINGS  Aorta normal caliber without aneurysm or dissection.  Major intra-abdominal vascular origins are patent.  Small umbilical hernia containing fat.  Liver, spleen, pancreas, kidneys, and adrenal glands normal appearance.  IUD  within uterus.  Normal appendix.  BILATERAL ovarian cysts, 2.7 x 2.6 x 2.4 cm on LEFT and 3.7 x 3.9 x 3.7 cm on RIGHT.  Large Nabothian cysts at cervix.  Bladder unremarkable.  Stomach and bowel loops normal appearance.  No mass, adenopathy, free fluid, inflammatory process, or acute osseous findings.  Review of the MIP images confirms the above findings.  IMPRESSION: No acute intra thoracic or intra-abdominal abnormalities.  Small umbilical hernia containing fat.  BILATERAL ovarian cysts.   Electronically Signed   By: Lavonia Dana M.D.   On: 02/07/2014 16:20   Ct Angio Abd/pel W/ And/or W/o  02/07/2014   CLINICAL DATA:  Chest pain going through to back, question aortic dissection  EXAM: CT ANGIOGRAPHY CHEST, ABDOMEN AND PELVIS  TECHNIQUE: Multidetector CT imaging through the chest, abdomen and pelvis was performed using the standard protocol during bolus administration of intravenous contrast. Multiplanar reconstructed images and MIPs were obtained and reviewed to evaluate the vascular anatomy.  CONTRAST:  155mL OMNIPAQUE IOHEXOL 350 MG/ML SOLN IV; no oral contrast administered  COMPARISON:  None  FINDINGS: CTA CHEST FINDINGS  Pre contrast images demonstrate normal caliber thoracic aorta.  No intramural hematoma or high attenuation crescent seen within aortic wall.  Following contrast, normal aortic enhancement without aneurysm  or dissection.  Pulmonary arteries patent without evidence of pulmonary embolism.  No thoracic adenopathy.  Lungs clear.  No infiltrate, pleural effusion, pneumothorax or acute osseous findings.  Review of the MIP images confirms the above findings.  CTA ABDOMEN AND PELVIS FINDINGS  Aorta normal caliber without aneurysm or dissection.  Major intra-abdominal vascular origins are patent.  Small umbilical hernia containing fat.  Liver, spleen, pancreas, kidneys, and adrenal glands normal appearance.  IUD within uterus.  Normal appendix.  BILATERAL ovarian cysts, 2.7 x 2.6 x 2.4 cm on LEFT and  3.7 x 3.9 x 3.7 cm on RIGHT.  Large Nabothian cysts at cervix.  Bladder unremarkable.  Stomach and bowel loops normal appearance.  No mass, adenopathy, free fluid, inflammatory process, or acute osseous findings.  Review of the MIP images confirms the above findings.  IMPRESSION: No acute intra thoracic or intra-abdominal abnormalities.  Small umbilical hernia containing fat.  BILATERAL ovarian cysts.   Electronically Signed   By: Lavonia Dana M.D.   On: 02/07/2014 16:20     EKG Interpretation   Date/Time:  Wednesday February 07 2014 16:14:40 EDT Ventricular Rate:  84 PR Interval:  167 QRS Duration: 100 QT Interval:  382 QTC Calculation: 451 R Axis:   91 Text Interpretation:  Sinus rhythm Borderline right axis deviation Low  voltage, precordial leads No significant change was found Confirmed by  Wyvonnia Dusky  MD, Hind Chesler [phone]) on 02/07/2014 4:18:40 PM      MDM   Final diagnoses:  Atypical chest pain   right-sided chest pain as sharp intermittent worse with movement and palpation. Associated with nausea. EKG normal sinus rhythm.  EKG is normal sinus. Pain is atypical for ACS. Pain lasts 30-45 seconds at a time and then resolves. It is worse with palpation and movement. Troponin is negative. D-dimer is negative.  HEART score 1. Extensive discussion with patient that her pain is atypical for ACS.  The pain lasts 30-45 seconds at a time. It is not exertional. There is no SOB or diaphoresis.  The pain is worse with movement and palpation and better with rest. Troponin negative x 2.  D-dimer negative. No evidence of PE or aortic dissection.  EKG unchanged.  Observation for cardiac rule out offered to patient which she declines. She is comfortable following up with her doctor for a stress test.  Return precautions discussed.    Date: 02/07/2014  Rate: 77  Rhythm: normal sinus rhythm  QRS Axis: normal  Intervals: normal  ST/T Wave abnormalities: normal  Conduction Disutrbances:none  Narrative  Interpretation:   Old EKG Reviewed: unchanged    Ezequiel Essex, MD 02/07/14 1815

## 2014-02-07 NOTE — ED Notes (Signed)
Per pt sts she woke up this am with chest pain and nausea. sts the pain is sharp and comes in waves. Denies injury.

## 2015-05-14 ENCOUNTER — Encounter (HOSPITAL_COMMUNITY): Payer: Self-pay | Admitting: *Deleted

## 2015-05-14 DIAGNOSIS — R61 Generalized hyperhidrosis: Secondary | ICD-10-CM | POA: Insufficient documentation

## 2015-05-14 DIAGNOSIS — Z720 Tobacco use: Secondary | ICD-10-CM | POA: Insufficient documentation

## 2015-05-14 DIAGNOSIS — M546 Pain in thoracic spine: Secondary | ICD-10-CM | POA: Insufficient documentation

## 2015-05-14 DIAGNOSIS — Z3202 Encounter for pregnancy test, result negative: Secondary | ICD-10-CM | POA: Insufficient documentation

## 2015-05-14 DIAGNOSIS — R112 Nausea with vomiting, unspecified: Secondary | ICD-10-CM | POA: Insufficient documentation

## 2015-05-14 DIAGNOSIS — Z862 Personal history of diseases of the blood and blood-forming organs and certain disorders involving the immune mechanism: Secondary | ICD-10-CM | POA: Insufficient documentation

## 2015-05-14 DIAGNOSIS — Z79899 Other long term (current) drug therapy: Secondary | ICD-10-CM | POA: Insufficient documentation

## 2015-05-14 DIAGNOSIS — N8320 Unspecified ovarian cysts: Secondary | ICD-10-CM | POA: Insufficient documentation

## 2015-05-14 MED ORDER — ONDANSETRON 4 MG PO TBDP: 4.0000 mg | ORAL_TABLET | Freq: Once | ORAL | Status: DC | PRN

## 2015-05-14 NOTE — ED Notes (Signed)
Patient presents with c/o abd pain and back pain.  States the pain is unbearable.  Has tried several things but nothing has helped the pain   The pain is tolerable then it hits and it is intolerable

## 2015-05-15 ENCOUNTER — Encounter (HOSPITAL_COMMUNITY): Payer: Self-pay | Admitting: Radiology

## 2015-05-15 ENCOUNTER — Emergency Department (HOSPITAL_COMMUNITY)
Admission: EM | Admit: 2015-05-15 | Discharge: 2015-05-15 | Disposition: A | Discharge status: Home / Self Care | Payer: Self-pay | Attending: Emergency Medicine | Admitting: Emergency Medicine

## 2015-05-15 ENCOUNTER — Emergency Department (HOSPITAL_COMMUNITY): Payer: No Typology Code available for payment source

## 2015-05-15 DIAGNOSIS — R109 Unspecified abdominal pain: Secondary | ICD-10-CM

## 2015-05-15 DIAGNOSIS — N83201 Unspecified ovarian cyst, right side: Secondary | ICD-10-CM

## 2015-05-15 DIAGNOSIS — N83202 Unspecified ovarian cyst, left side: Secondary | ICD-10-CM

## 2015-05-15 LAB — URINALYSIS, ROUTINE W REFLEX MICROSCOPIC
BILIRUBIN URINE: NEGATIVE
Glucose, UA: NEGATIVE mg/dL
KETONES UR: NEGATIVE mg/dL
NITRITE: NEGATIVE
PROTEIN: NEGATIVE mg/dL
Specific Gravity, Urine: 1.007 (ref 1.005–1.030)
UROBILINOGEN UA: 0.2 mg/dL (ref 0.0–1.0)
pH: 6.5 (ref 5.0–8.0)

## 2015-05-15 LAB — PREGNANCY, URINE: Preg Test, Ur: NEGATIVE

## 2015-05-15 LAB — COMPREHENSIVE METABOLIC PANEL
ALT: 17 U/L (ref 14–54)
AST: 18 U/L (ref 15–41)
Albumin: 4.2 g/dL (ref 3.5–5.0)
Alkaline Phosphatase: 86 U/L (ref 38–126)
Anion gap: 8 (ref 5–15)
BILIRUBIN TOTAL: 0.7 mg/dL (ref 0.3–1.2)
BUN: 7 mg/dL (ref 6–20)
CHLORIDE: 102 mmol/L (ref 101–111)
CO2: 27 mmol/L (ref 22–32)
CREATININE: 0.68 mg/dL (ref 0.44–1.00)
Calcium: 9.6 mg/dL (ref 8.9–10.3)
Glucose, Bld: 104 mg/dL — ABNORMAL HIGH (ref 65–99)
Potassium: 3.7 mmol/L (ref 3.5–5.1)
Sodium: 137 mmol/L (ref 135–145)
TOTAL PROTEIN: 7.7 g/dL (ref 6.5–8.1)

## 2015-05-15 LAB — LIPASE, BLOOD: LIPASE: 23 U/L (ref 22–51)

## 2015-05-15 LAB — CBC
HCT: 39.1 % (ref 36.0–46.0)
Hemoglobin: 13.6 g/dL (ref 12.0–15.0)
MCH: 32 pg (ref 26.0–34.0)
MCHC: 34.8 g/dL (ref 30.0–36.0)
MCV: 92 fL (ref 78.0–100.0)
PLATELETS: 278 10*3/uL (ref 150–400)
RBC: 4.25 MIL/uL (ref 3.87–5.11)
RDW: 15.1 % (ref 11.5–15.5)
WBC: 9.7 10*3/uL (ref 4.0–10.5)

## 2015-05-15 LAB — URINE MICROSCOPIC-ADD ON

## 2015-05-15 MED ORDER — KETOROLAC TROMETHAMINE 30 MG/ML IJ SOLN
30.0000 mg | Freq: Once | INTRAMUSCULAR | Status: AC
  Administered 2015-05-15: 30 mg via INTRAVENOUS
  Filled 2015-05-15: qty 1

## 2015-05-15 MED ORDER — TRAMADOL HCL 50 MG PO TABS: 50.0000 mg | ORAL_TABLET | Freq: Two times a day (BID) | ORAL | Status: DC | PRN

## 2015-05-15 MED ORDER — METOCLOPRAMIDE HCL 5 MG/ML IJ SOLN
10.0000 mg | Freq: Once | INTRAMUSCULAR | Status: AC
  Administered 2015-05-15: 10 mg via INTRAVENOUS
  Filled 2015-05-15: qty 2

## 2015-05-15 MED ORDER — FENTANYL CITRATE (PF) 100 MCG/2ML IJ SOLN
INTRAMUSCULAR | Status: AC
  Filled 2015-05-15: qty 2

## 2015-05-15 MED ORDER — SODIUM CHLORIDE 0.9 % IV BOLUS (SEPSIS)
1000.0000 mL | Freq: Once | INTRAVENOUS | Status: AC
  Administered 2015-05-15: 1000 mL via INTRAVENOUS

## 2015-05-15 MED ORDER — FENTANYL CITRATE (PF) 100 MCG/2ML IJ SOLN
50.0000 ug | Freq: Once | INTRAMUSCULAR | Status: AC
  Administered 2015-05-15: 50 ug via INTRAVENOUS

## 2015-05-15 MED ORDER — HYDROMORPHONE HCL 1 MG/ML IJ SOLN
1.0000 mg | Freq: Once | INTRAMUSCULAR | Status: AC
  Administered 2015-05-15: 1 mg via INTRAVENOUS
  Filled 2015-05-15: qty 1

## 2015-05-15 NOTE — ED Notes (Signed)
While sitting in triage, patient vomited, vageled and became diaphoretic.

## 2015-05-15 NOTE — ED Provider Notes (Signed)
CSN: [id number]     Arrival date & time 05/14/15  2315 History  This chart was scribed for Everlene Balls, MD by Meriel Pica, ED Scribe. This patient was seen in room D36C/D36C and the patient's care was started 1:39 AM.   Chief Complaint  Patient presents with  . Abdominal Pain    The history is provided by the patient. No language interpreter was used.   HPI Comments: [name] is a 44 y.o. female who presents to the Emergency Department complaining of constant, severe, lower abdominal pain that started in her back and has since radiated to her abdomen. She also reports mid back pain that is worse on the right side. Pt states she believed the pain to be gastritis and she took tylenol and imodium with no relief. She has also taken Pepto Bismol without relief. She reports with consumption of Ginger Ale she experienced nausea. The pt also became diaphoretic and had 1 episode of emesis s/p taking pain medication in triage. She denies dysuria, hematuria, bladder or bowel incontinence, weakness or numbness in BLE, trauma or fall attributable to back pain, or a PMhx of renal calculi.   Past Medical History  Diagnosis Date  . Anemia    History reviewed. No pertinent past surgical history. No family history on file. Social History  Substance Use Topics  . Smoking status: Current Every Day Smoker -- 1.00 packs/day    Types: Cigarettes  . Smokeless tobacco: Never Used  . Alcohol Use: Yes     Comment: occasional   OB History    No data available     Review of Systems 10 Systems reviewed and are negative for acute change except as noted in the HPI.  Allergies  Review of patient's allergies indicates no known allergies.  Home Medications   Prior to Admission medications   Medication Sig Start Date End Date Taking? Authorizing Provider  MELATONIN PO Take 1 tablet by mouth daily.    Historical Provider, MD   BP 102/71 mmHg  Pulse 100  Temp(Src) 98.3 F (36.8 C) (Oral)  Resp  20  Ht 5\' 5"  (1.651 m)  Wt 160 lb (72.576 kg)  BMI 26.63 kg/m2  SpO2 99% Physical Exam  Constitutional: She is oriented to person, place, and time. She appears well-developed and well-nourished. No distress.  HENT:  Head: Normocephalic and atraumatic.  Nose: Nose normal.  Mouth/Throat: Oropharynx is clear and moist. No oropharyngeal exudate.  Eyes: Conjunctivae and EOM are normal. Pupils are equal, round, and reactive to light. No scleral icterus.  Neck: Normal range of motion. Neck supple. No JVD present. No tracheal deviation present. No thyromegaly present.  Cardiovascular: Normal rate, regular rhythm and normal heart sounds.  Exam reveals no gallop and no friction rub.   No murmur heard. Pulmonary/Chest: Effort normal and breath sounds normal. No respiratory distress. She has no wheezes. She exhibits no tenderness.  Abdominal: Soft. Bowel sounds are normal. She exhibits no distension and no mass. There is tenderness. There is no rebound and no guarding.  Left CVA tenderness; mild LLQ TTP.   Musculoskeletal: Normal range of motion. She exhibits no edema or tenderness.  Lymphadenopathy:    She has no cervical adenopathy.  Neurological: She is alert and oriented to person, place, and time. No cranial nerve deficit. She exhibits normal muscle tone.  Normal strength and sensation in all extremities.   Skin: Skin is warm and dry. No rash noted. No erythema. No pallor.  Nursing note and  vitals reviewed.   ED Course  Procedures  DIAGNOSTIC STUDIES: Oxygen Saturation is 99% on RA, normal by my interpretation.    COORDINATION OF CARE: 1:45 AM Discussed treatment plan with pt. Pt acknowledges and agrees to plan.   Labs Review Labs Reviewed  COMPREHENSIVE METABOLIC PANEL - Abnormal; Notable for the following:    Glucose, Bld 104 (*)    All other components within normal limits  URINALYSIS, ROUTINE W REFLEX MICROSCOPIC (NOT AT [facility]) - Abnormal; Notable for the following:    APPearance  CLOUDY (*)    Hgb urine dipstick TRACE (*)    Leukocytes, UA TRACE (*)    All other components within normal limits  LIPASE, BLOOD  CBC  URINE MICROSCOPIC-ADD ON  PREGNANCY, URINE    Imaging Review Ct Renal Stone Study  05/15/2015   CLINICAL DATA:  Left flank pain.  Back and abdominal pain.  EXAM: CT ABDOMEN AND PELVIS WITHOUT CONTRAST  TECHNIQUE: Multidetector CT imaging of the abdomen and pelvis was performed following the standard protocol without IV contrast.  COMPARISON:  CT 02/07/2014  FINDINGS: Hypoventilatory atelectasis at the lung bases.  The kidneys are symmetric in size without stones or hydronephrosis. There is no perinephric stranding. Both ureters are decompressed without stones along the course.  Evaluation of the remaining solid and hollow viscera is limited given lack of contrast. The unenhanced liver, gallbladder, spleen, pancreas, and adrenal glands are normal.  Stomach is physiologically distended. There are no dilated or thickened bowel loops. The appendix is normal. Liquid and solid stool within the cecum, ascending and proximal transverse colon. Small amount of solid stool within the descending colon. No colonic wall thickening or pericolonic inflammatory change. No free air, free fluid, or intra-abdominal fluid collection.  No retroperitoneal adenopathy. Abdominal aorta is normal in caliber. Small fat containing umbilical hernia.  Within the pelvis, intrauterine device within the uterus. Right ovarian cyst measures 2.7 cm, left ovarian cyst measures 1.7 cm. No pelvic free fluid. There are nabothian cysts in the cervix. Urinary bladder is minimally distended. No pelvic free fluid. No pelvic adenopathy.  There are no acute or suspicious osseous abnormalities.  IMPRESSION: 1.  No renal stones or obstructive uropathy. 2. Small bilateral ovarian cysts. 3. Small fat containing umbilical hernia.   Electronically Signed   By: Jeb Levering M.D.   On: 05/15/2015 04:22   I have  personally reviewed and evaluated these images and lab results as part of my medical decision-making.   EKG Interpretation   Date/Time:  Tuesday May 14 2015 23:48:07 EDT Ventricular Rate:  92 PR Interval:  160 QRS Duration: 90 QT Interval:  354 QTC Calculation: 437 R Axis:   100 Text Interpretation:  Normal sinus rhythm Rightward axis Cannot rule out  Anterior infarct , age undetermined Abnormal ECG No significant change  since last tracing Confirmed by Glynn Octave [phone]) on 05/15/2015  1:38:05 AM      MDM   Final diagnoses:  None    Patient presents to the emergency department for severe abdominal and back pain. The pain is mostly in her left flank radiating to her abdomen. I concern for nephrolithiasis. She was given IV fluids, Toradol, Dilaudid for pain relief. She did achieve good relief was found sleeping comfortably in the room. CT scan reveals bilateral ovarian cysts, the largest 2.7 cm. This is likely the cause of her pain. Education was provided, OB/GYN follow-up was given as well. Her vital signs remain within her normal limits and  she is safe for discharge.  I personally performed the services described in this documentation, which was scribed in my presence. The recorded information has been reviewed and is accurate.   Everlene Balls, MD 05/15/15 [phone]

## 2015-05-15 NOTE — Discharge Instructions (Signed)
Ovarian Cyst [name], your CT scan shows ovarian cysts on both sides.  This may be causing your pain.  No kidney stones were found.  See OB/GYN within 3 days for close follow up.  Take ibuprofen for pain, if the pain becomes severe, take tramadol. For any worsening come back to emergency department immediately. Thank you. An ovarian cyst is a sac filled with fluid or blood. This sac is attached to the ovary. Some cysts go away on their own. Other cysts need treatment.  HOME CARE   Only take medicine as told by your doctor.  Follow up with your doctor as told.  Get regular pelvic exams and Pap tests. GET HELP IF:  Your periods are late, not regular, or painful.  You stop having periods.  Your belly (abdominal) or pelvic pain does not go away.  Your belly becomes large or puffy (swollen).  You have a hard time peeing (totally emptying your bladder).  You have pressure on your bladder.  You have pain during sex.  You feel fullness, pressure, or discomfort in your belly.  You lose weight for no reason.  You feel sick most of the time.  You have a hard time pooping (constipation).  You do not feel like eating.  You develop pimples (acne).  You have an increase in hair on your body and face.  You are gaining weight for no reason.  You think you are pregnant. GET HELP RIGHT AWAY IF:   Your belly pain gets worse.  You feel sick to your stomach (nauseous), and you throw up (vomit).  You have a fever that comes on fast.  You have belly pain while pooping (bowel movement).  Your periods are heavier than usual. MAKE SURE YOU:   Understand these instructions.  Will watch your condition.  Will get help right away if you are not doing well or get worse. Document Released: 02/03/2008 Document Revised: 06/07/2013 Document Reviewed: 04/24/2013 [facility] Patient Information 2015 [place], [place]. This information is not intended to replace advice given to you by your  health care provider. Make sure you discuss any questions you have with your health care provider.

## 2015-09-03 ENCOUNTER — Emergency Department (HOSPITAL_COMMUNITY)
Admission: EM | Admit: 2015-09-03 | Discharge: 2015-09-04 | Disposition: A | Discharge status: Home / Self Care | Payer: No Typology Code available for payment source | Attending: Emergency Medicine | Admitting: Emergency Medicine

## 2015-09-03 ENCOUNTER — Encounter (HOSPITAL_COMMUNITY): Payer: Self-pay | Admitting: Emergency Medicine

## 2015-09-03 DIAGNOSIS — Z862 Personal history of diseases of the blood and blood-forming organs and certain disorders involving the immune mechanism: Secondary | ICD-10-CM | POA: Insufficient documentation

## 2015-09-03 DIAGNOSIS — Y9389 Activity, other specified: Secondary | ICD-10-CM | POA: Insufficient documentation

## 2015-09-03 DIAGNOSIS — R102 Pelvic and perineal pain: Secondary | ICD-10-CM | POA: Insufficient documentation

## 2015-09-03 DIAGNOSIS — L250 Unspecified contact dermatitis due to cosmetics: Secondary | ICD-10-CM | POA: Insufficient documentation

## 2015-09-03 DIAGNOSIS — Y9289 Other specified places as the place of occurrence of the external cause: Secondary | ICD-10-CM | POA: Insufficient documentation

## 2015-09-03 DIAGNOSIS — X58XXXA Exposure to other specified factors, initial encounter: Secondary | ICD-10-CM | POA: Insufficient documentation

## 2015-09-03 DIAGNOSIS — Y998 Other external cause status: Secondary | ICD-10-CM | POA: Insufficient documentation

## 2015-09-03 DIAGNOSIS — T7840XA Allergy, unspecified, initial encounter: Secondary | ICD-10-CM

## 2015-09-03 NOTE — ED Notes (Addendum)
Pt states that she has had allergic reactions to hair dye/treatments and tried to do her hair again before Christmas and has had itching in her scalp and about her body. States she has been taking benadryl without help and using castor oil on her scalp. Also states that her ovarian cysts may be 'acting up' as she has had intermittent LLQ pain. Alert and oriented.

## 2015-09-04 MED ORDER — PREDNISONE 20 MG PO TABS
60.0000 mg | ORAL_TABLET | Freq: Once | ORAL | Status: AC
  Administered 2015-09-04: 60 mg via ORAL
  Filled 2015-09-04: qty 3

## 2015-09-04 MED ORDER — HYDROXYZINE HCL 25 MG PO TABS: 25.0000 mg | ORAL_TABLET | ORAL | Status: DC | PRN

## 2015-09-04 MED ORDER — HYDROXYZINE HCL 25 MG PO TABS
50.0000 mg | ORAL_TABLET | Freq: Once | ORAL | Status: AC
  Administered 2015-09-04: 50 mg via ORAL
  Filled 2015-09-04: qty 2

## 2015-09-04 MED ORDER — PREDNISONE 20 MG PO TABS: 20.0000 mg | ORAL_TABLET | Freq: Two times a day (BID) | ORAL | Status: DC

## 2015-09-04 NOTE — ED Provider Notes (Signed)
CSN: [id number]     Arrival date & time 09/03/15  2300 History  By signing my name below, I, [name], attest that this documentation has been prepared under the direction and in the presence of Tanna Furry, MD. Electronically Signed: Altamease Lloyd, ED Scribe. 09/04/2015. 1:33 AM  Chief Complaint  Patient presents with  . Allergic Reaction  . Pelvic Pain   The history is provided by the patient. No language interpreter was used.   [name] is a 45 y.o. female with history of eczema who presents to the Emergency Department complaining of diffuse itching with onset 9 days ago after using hair dye. She had a reaction 1 year ago after using hair dye. At that time she had diffuse scalp flaking and itching that resolved over time. This time she used a different product but had a similar reaction at the scalp in addition to the itching about the whole body. Application of castor oil to the scalp and Benadryl have provided insufficient relief at home. Pt denies difficulty breathing or swallowing. She also complains of pelvic pain that she associates with ovarian cysts.   Past Medical History  Diagnosis Date  . Anemia    History reviewed. No pertinent past surgical history. History reviewed. No pertinent family history. Social History  Substance Use Topics  . Smoking status: Current Every Day Smoker -- 1.00 packs/day    Types: Cigarettes  . Smokeless tobacco: Never Used  . Alcohol Use: Yes     Comment: occasional   OB History    No data available     Review of Systems  Constitutional: Negative for fever, chills, diaphoresis, appetite change and fatigue.  HENT: Negative for mouth sores, sore throat and trouble swallowing.   Eyes: Negative for visual disturbance.  Respiratory: Negative for cough, chest tightness, shortness of breath and wheezing.   Cardiovascular: Negative for chest pain.  Gastrointestinal: Negative for nausea, vomiting, abdominal pain, diarrhea and  abdominal distention.  Endocrine: Negative for polydipsia, polyphagia and polyuria.  Genitourinary: Positive for pelvic pain. Negative for dysuria, frequency and hematuria.  Musculoskeletal: Negative for gait problem.  Skin: Negative for color change, pallor and rash.       Diffuse itching  Neurological: Negative for dizziness, syncope, light-headedness and headaches.  Hematological: Does not bruise/bleed easily.  Psychiatric/Behavioral: Negative for behavioral problems and confusion.   Allergies  Review of patient's allergies indicates no known allergies.  Home Medications   Prior to Admission medications   Medication Sig Start Date End Date Taking? Authorizing Provider  hydrOXYzine (ATARAX/VISTARIL) 25 MG tablet Take 1 tablet (25 mg total) by mouth every 4 (four) hours as needed for itching. 09/04/15   Tanna Furry, MD  predniSONE (DELTASONE) 20 MG tablet Take 1 tablet (20 mg total) by mouth 2 (two) times daily with a meal. 09/04/15   Tanna Furry, MD  traMADol (ULTRAM) 50 MG tablet Take 1 tablet (50 mg total) by mouth every 12 (twelve) hours as needed for severe pain. 05/15/15   Everlene Balls, MD   BP 114/78 mmHg  Pulse 109  Temp(Src) 98.3 F (36.8 C) (Oral)  Ht 5\' 5"  (1.651 m)  Wt 180 lb (81.647 kg)  BMI 29.95 kg/m2  SpO2 100% Physical Exam  Constitutional: She is oriented to person, place, and time. She appears well-developed and well-nourished. No distress.  HENT:  Head: Normocephalic.  Eyes: Conjunctivae are normal. Pupils are equal, round, and reactive to light. No scleral icterus.  Neck: Normal range of motion.  Neck supple. No thyromegaly present.  Cardiovascular: Normal rate and regular rhythm.  Exam reveals no gallop and no friction rub.   No murmur heard. Pulmonary/Chest: Effort normal and breath sounds normal. No respiratory distress. She has no wheezes. She has no rales.  Abdominal: Soft. Bowel sounds are normal. She exhibits no distension. There is no tenderness. There is no  rebound.  Musculoskeletal: Normal range of motion.  Neurological: She is alert and oriented to person, place, and time.  Skin: Skin is warm and dry.  Entire scalp looks red and inflamed with scaling.  Several areas of rash consistent with eczema on the back.  Actively scratching at multiple areas.  Psychiatric: She has a normal mood and affect. Her behavior is normal.    ED Course  Procedures (including critical care time) DIAGNOSTIC STUDIES: Oxygen Saturation is 100% on RA,  normal by my interpretation.    COORDINATION OF CARE: 1:29 AM Discussed treatment plan which includes atarax and steroids with pt at bedside and pt agreed to plan.  Labs Review Labs Reviewed - No data to display  Imaging Review No results found.   EKG Interpretation None      MDM   Final diagnoses:  Allergic reaction, initial encounter    Localized rashes the scalp. Diffuse erythema. No frank eyes. No apparent GI, airway, or pulmonary effects. Plan prednisone, Atarax. Primary care follow-up.   Tanna Furry, MD 09/04/15 [phone]

## 2015-09-04 NOTE — ED Notes (Signed)
Discharge instructions, follow up care, and rx x2 reviewed with patient. Patient verbalized understanding. 

## 2015-09-04 NOTE — Discharge Instructions (Signed)
Atarax for itch.  Prednisone as prescribed.  Hypo allergenic shampoo.  No other hair care products.

## 2015-11-05 IMAGING — CR DG CHEST 2V
2 series · 2 of 2 positions shown · non-contrast
Comparison: 03/28/2013

CLINICAL DATA: Chest pain

EXAM:
CHEST  2 VIEW

[w chest pa]
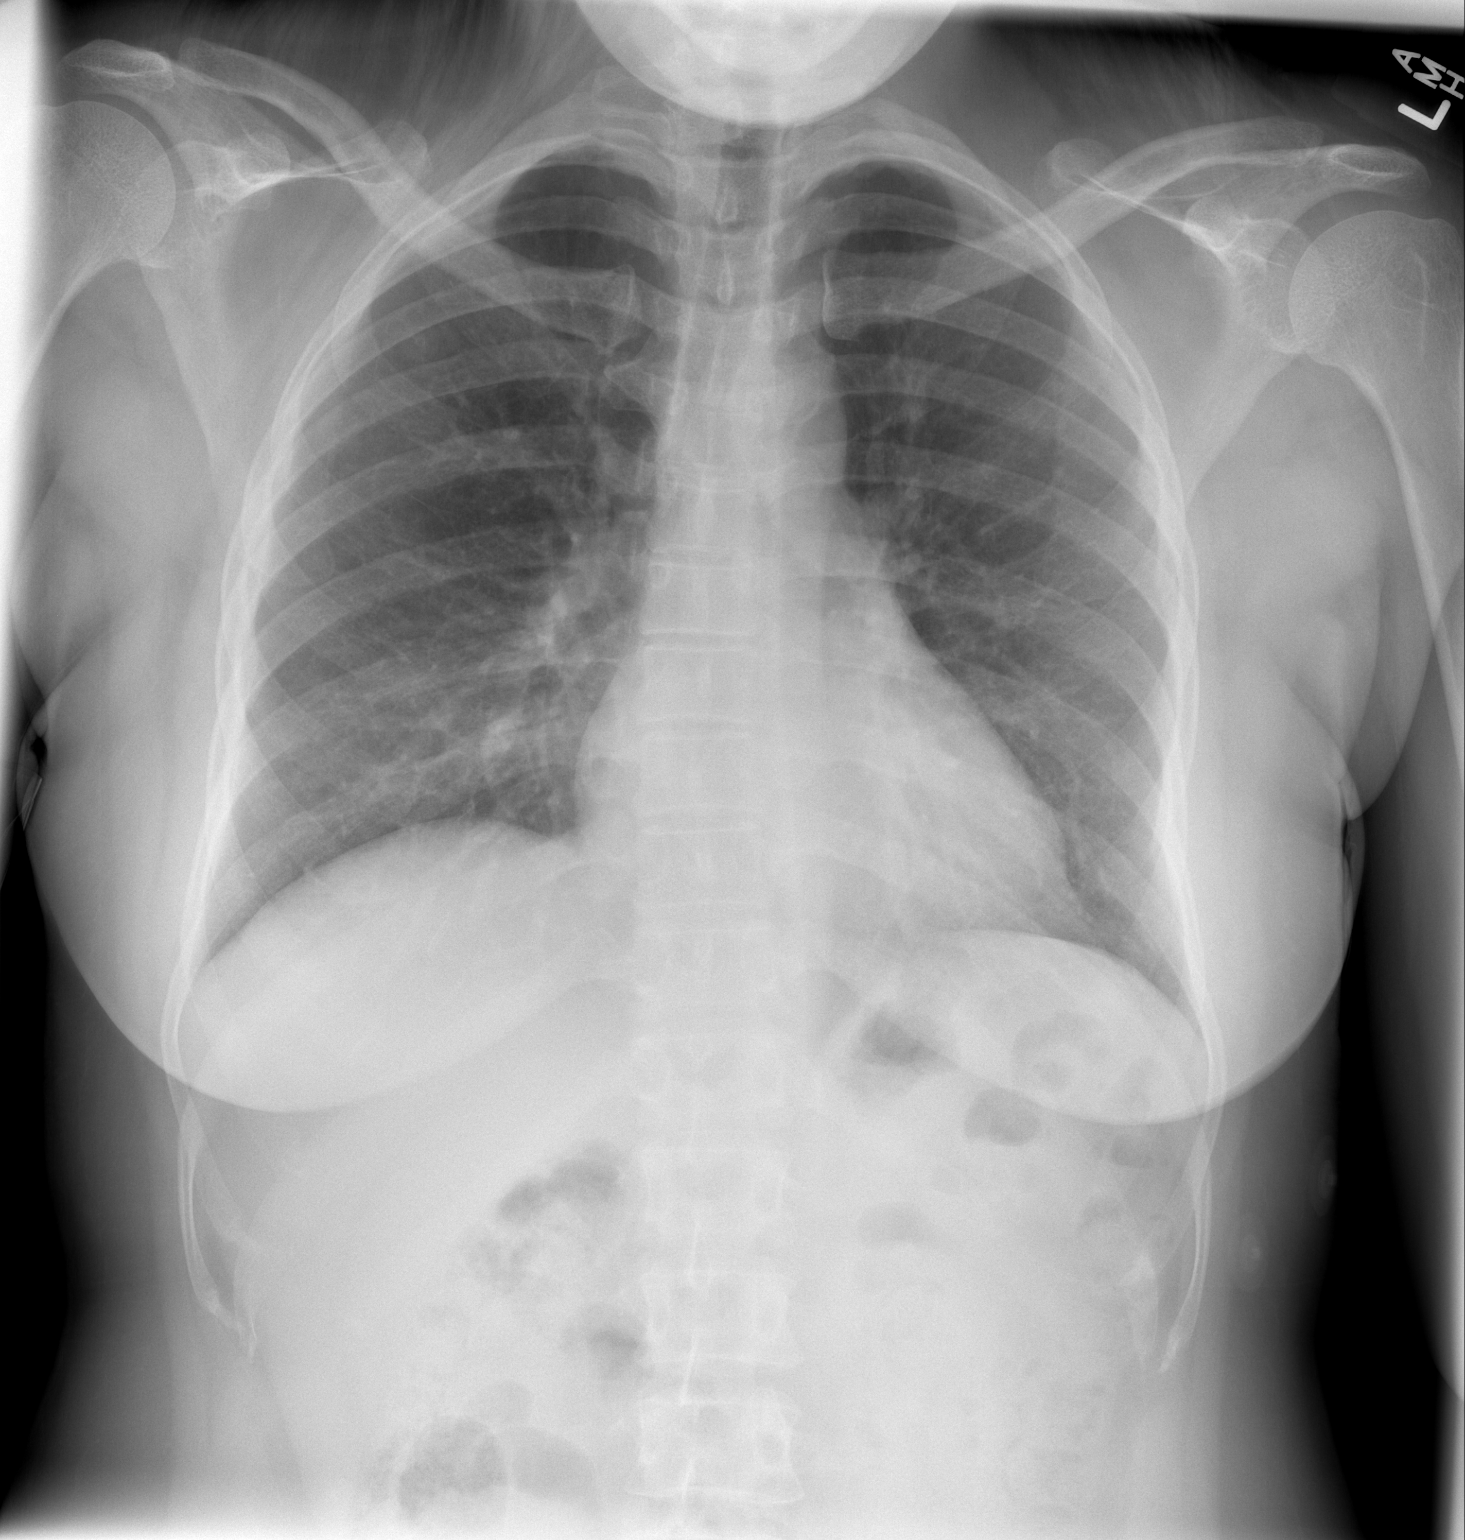

[w chest lat]
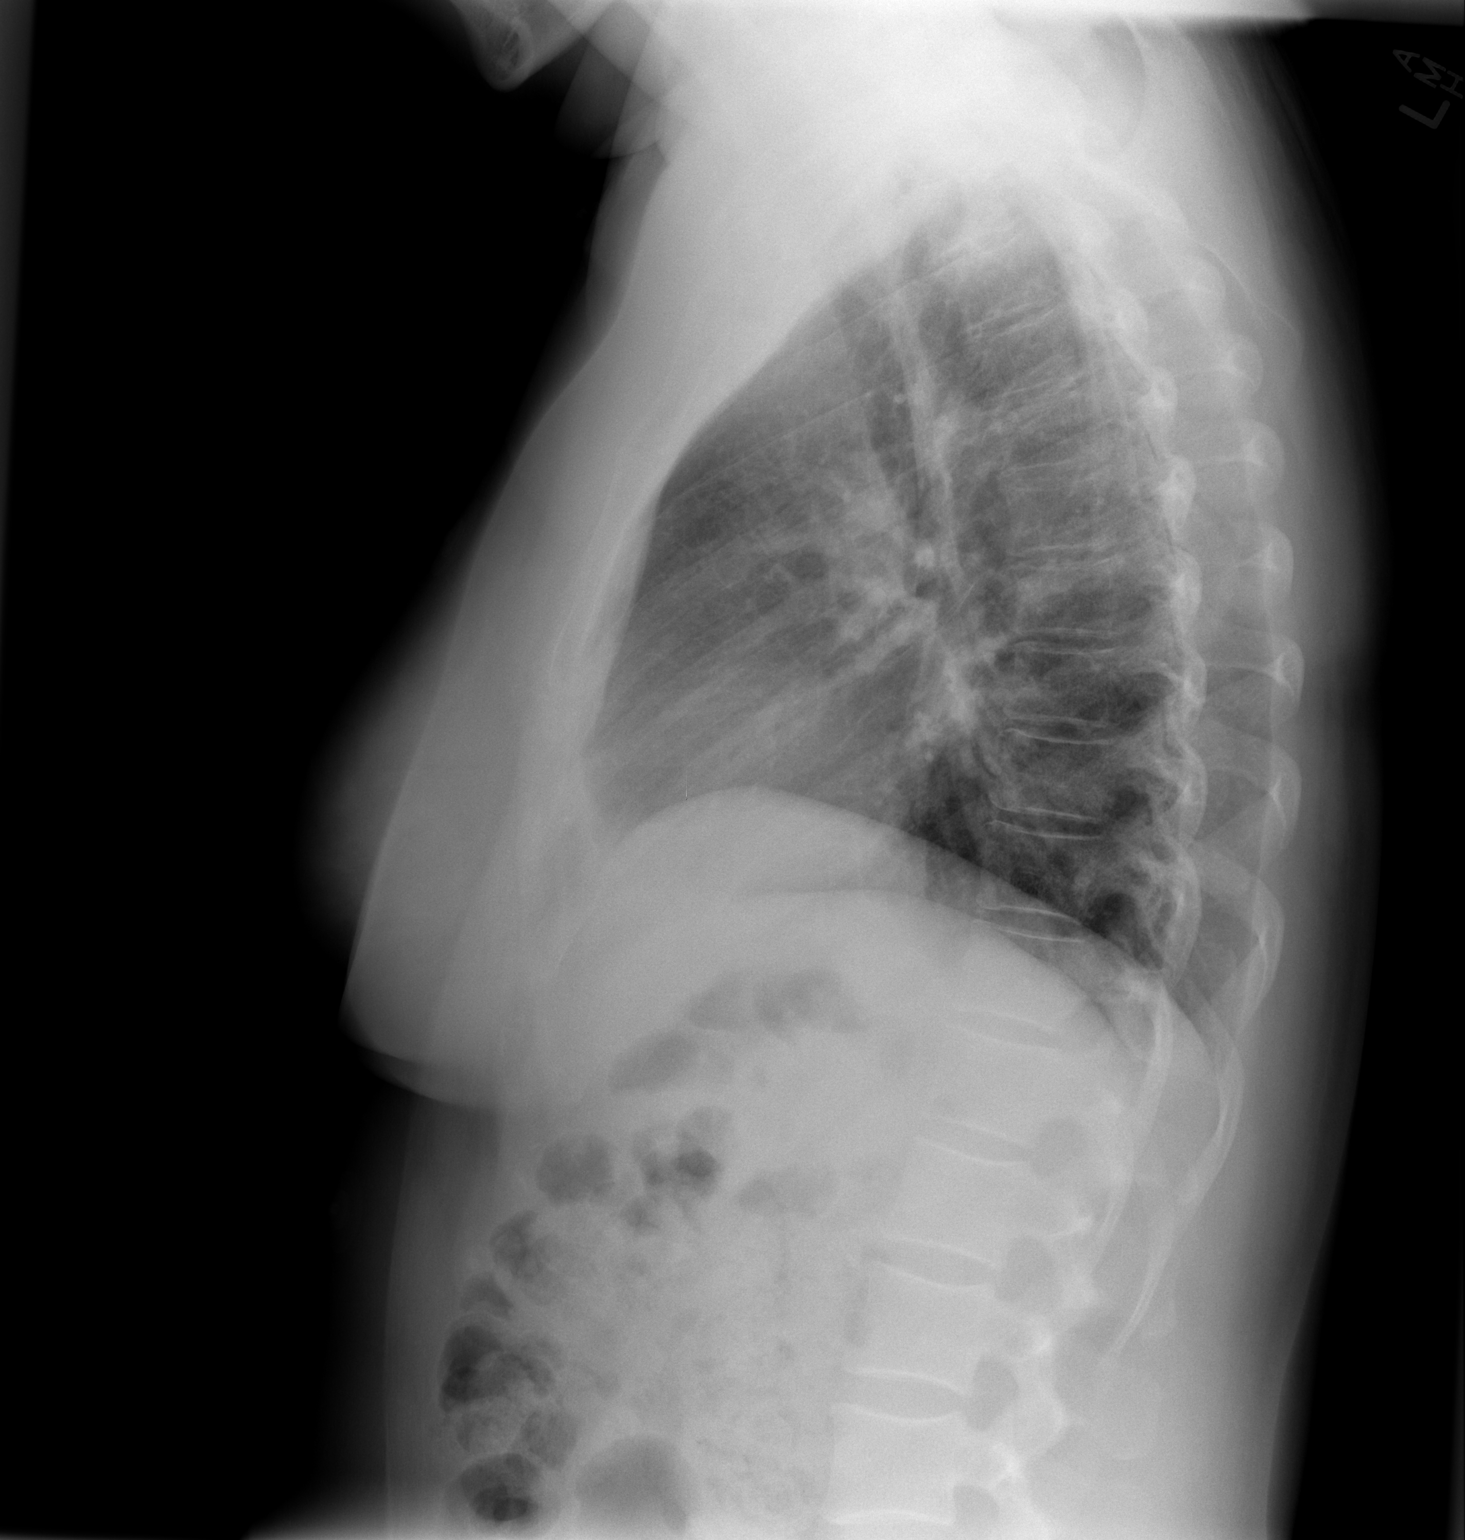

[2 of 2 positions shown; findings below may reference images not displayed]

FINDINGS: The heart size and mediastinal contours are within normal limits.
Both lungs are clear. The visualized skeletal structures are
unremarkable.
IMPRESSION: No active cardiopulmonary disease.

## 2016-05-09 ENCOUNTER — Encounter (HOSPITAL_COMMUNITY): Payer: Self-pay | Admitting: Emergency Medicine

## 2016-05-09 ENCOUNTER — Emergency Department (HOSPITAL_COMMUNITY): Payer: No Typology Code available for payment source

## 2016-05-09 ENCOUNTER — Emergency Department (HOSPITAL_COMMUNITY)
Admission: EM | Admit: 2016-05-09 | Discharge: 2016-05-09 | Disposition: A | Discharge status: Home / Self Care | Payer: No Typology Code available for payment source | Attending: Emergency Medicine | Admitting: Emergency Medicine

## 2016-05-09 DIAGNOSIS — M546 Pain in thoracic spine: Secondary | ICD-10-CM | POA: Insufficient documentation

## 2016-05-09 DIAGNOSIS — Z5321 Procedure and treatment not carried out due to patient leaving prior to being seen by health care provider: Secondary | ICD-10-CM | POA: Insufficient documentation

## 2016-05-09 DIAGNOSIS — F17210 Nicotine dependence, cigarettes, uncomplicated: Secondary | ICD-10-CM | POA: Insufficient documentation

## 2016-05-09 DIAGNOSIS — R079 Chest pain, unspecified: Secondary | ICD-10-CM | POA: Insufficient documentation

## 2016-05-09 LAB — COMPREHENSIVE METABOLIC PANEL
ALT: 18 U/L (ref 14–54)
ANION GAP: 7 (ref 5–15)
AST: 18 U/L (ref 15–41)
Albumin: 3.7 g/dL (ref 3.5–5.0)
Alkaline Phosphatase: 79 U/L (ref 38–126)
BUN: 11 mg/dL (ref 6–20)
CO2: 28 mmol/L (ref 22–32)
CREATININE: 0.84 mg/dL (ref 0.44–1.00)
Calcium: 9.6 mg/dL (ref 8.9–10.3)
Chloride: 105 mmol/L (ref 101–111)
GFR calc non Af Amer: >60 mL/min (ref >60)
Glucose, Bld: 139 mg/dL — ABNORMAL HIGH (ref 65–99)
Potassium: 3.6 mmol/L (ref 3.5–5.1)
SODIUM: 140 mmol/L (ref 135–145)
Total Bilirubin: 0.5 mg/dL (ref 0.3–1.2)
Total Protein: 7 g/dL (ref 6.5–8.1)

## 2016-05-09 LAB — CBC WITH DIFFERENTIAL/PLATELET
BASOS PCT: 0 %
Basophils Absolute: 0 10*3/uL (ref 0.0–0.1)
EOS PCT: 3 %
Eosinophils Absolute: 0.1 10*3/uL (ref 0.0–0.7)
HCT: 39.7 % (ref 36.0–46.0)
Hemoglobin: 13 g/dL (ref 12.0–15.0)
LYMPHS ABS: 2.6 10*3/uL (ref 0.7–4.0)
Lymphocytes Relative: 47 %
MCH: 30.8 pg (ref 26.0–34.0)
MCHC: 32.7 g/dL (ref 30.0–36.0)
MCV: 94.1 fL (ref 78.0–100.0)
Monocytes Absolute: 0.4 10*3/uL (ref 0.1–1.0)
Monocytes Relative: 7 %
Neutro Abs: 2.4 10*3/uL (ref 1.7–7.7)
Neutrophils Relative %: 43 %
PLATELETS: 219 10*3/uL (ref 150–400)
RBC: 4.22 MIL/uL (ref 3.87–5.11)
RDW: 14.6 % (ref 11.5–15.5)
WBC: 5.6 10*3/uL (ref 4.0–10.5)

## 2016-05-09 NOTE — ED Triage Notes (Signed)
Pt c/o central chest pain and mid upper back pain onset earlier tonight.  Pt describes the pain in her chest as pressure and pain in her back as sharp

## 2016-05-09 NOTE — ED Notes (Signed)
No answer x3

## 2017-02-09 IMAGING — CT CT RENAL STONE PROTOCOL
2 of 4 series · 16 of 46 positions shown, 18 images · non-contrast
Comparison: CT 02/07/2014

CLINICAL DATA: Left flank pain.  Back and abdominal pain.

EXAM:
CT ABDOMEN AND PELVIS WITHOUT CONTRAST
TECHNIQUE: Multidetector CT imaging of the abdomen and pelvis was performed
following the standard protocol without IV contrast.

[Series 2: stone study 5.0 i30f 1 · axial · 0.71mm/px · z∈[-436,-1]mm · 13 of 95 slices shown, 15 images]
[im 4/95  soft-tissue]
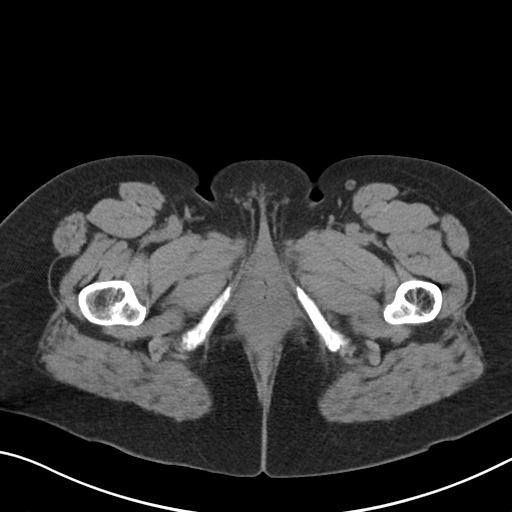
[im 4/95  bone]
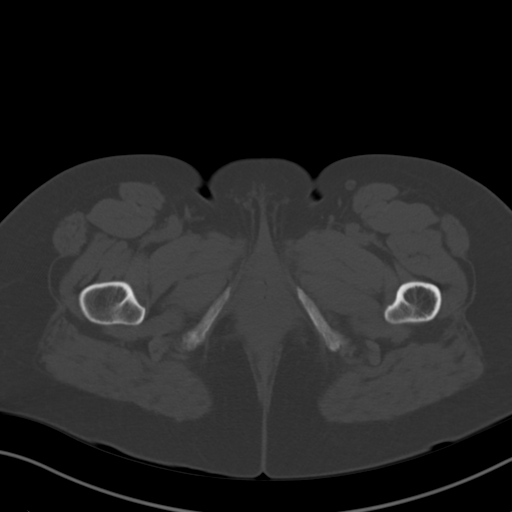
[im 12/95  soft-tissue]
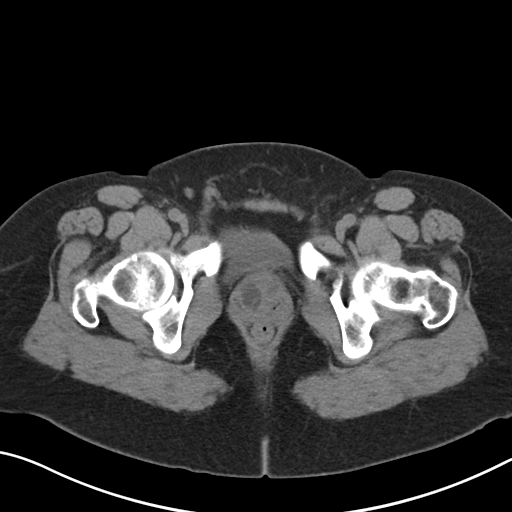
[im 20/95  soft-tissue]
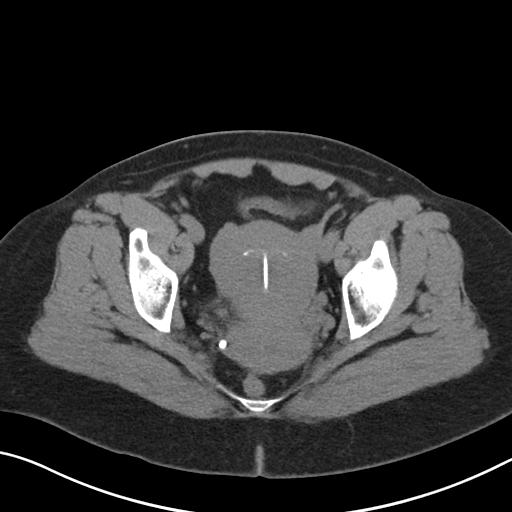
[im 28/95  soft-tissue]
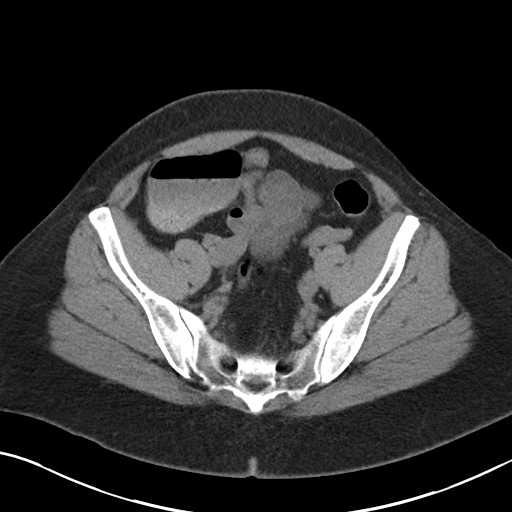
[im 32/95  soft-tissue]
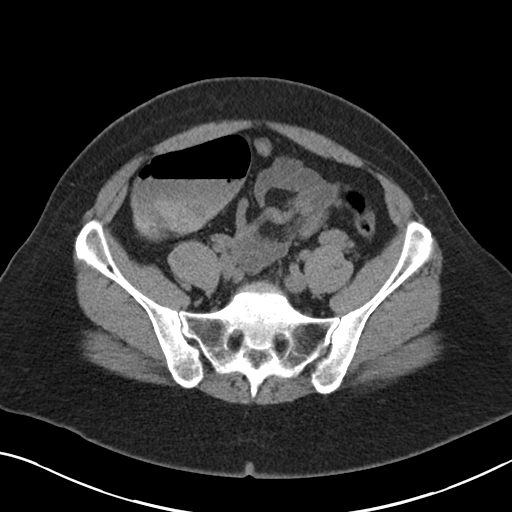
[im 40/95  soft-tissue]
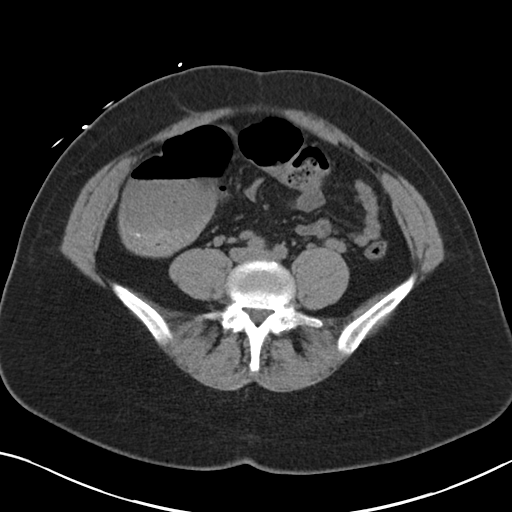
[im 48/95  soft-tissue]
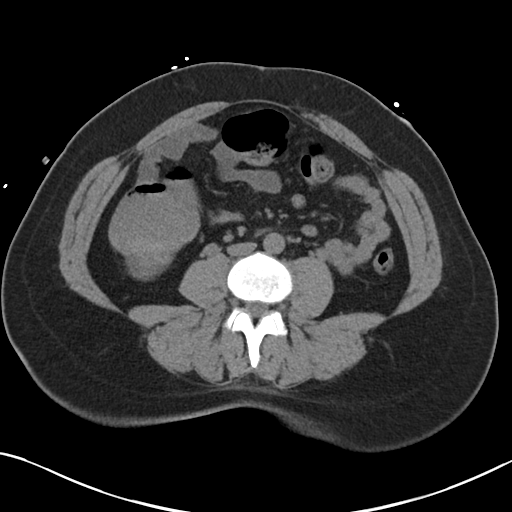
[im 55/95  soft-tissue]
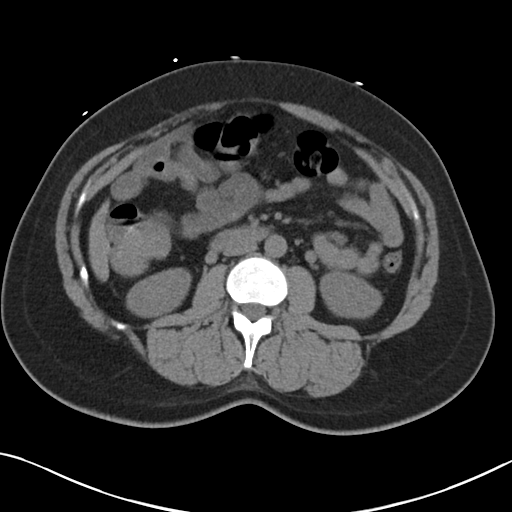
[im 63/95  soft-tissue]
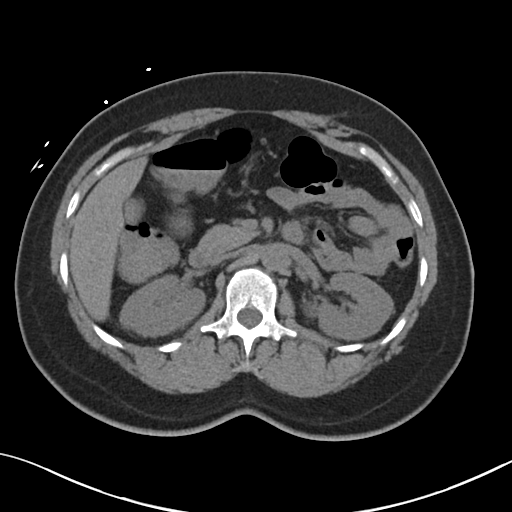
[im 63/95  bone]
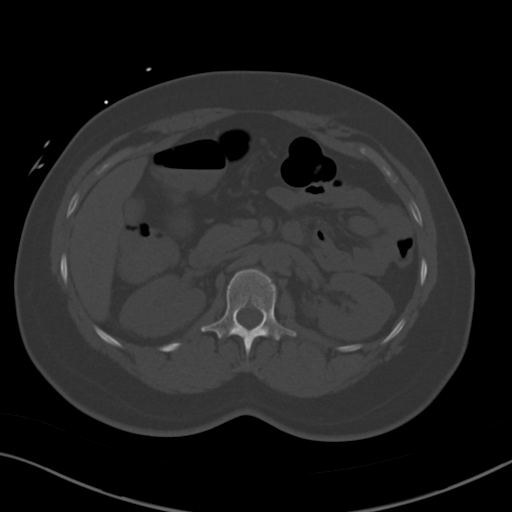
[im 67/95  soft-tissue]
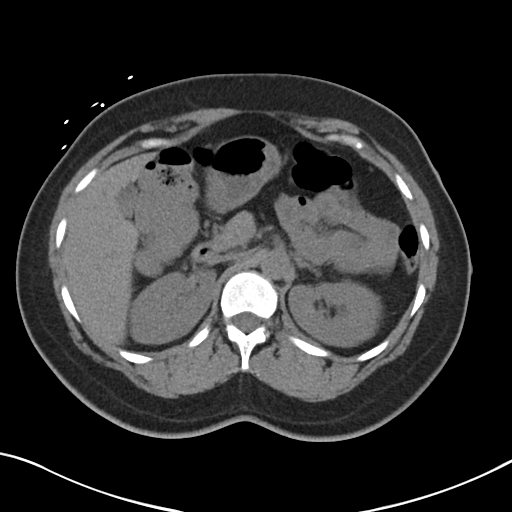
[im 75/95  soft-tissue]
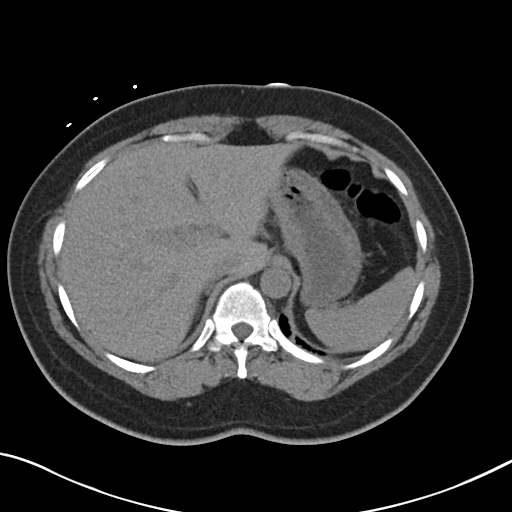
[im 83/95  soft-tissue]
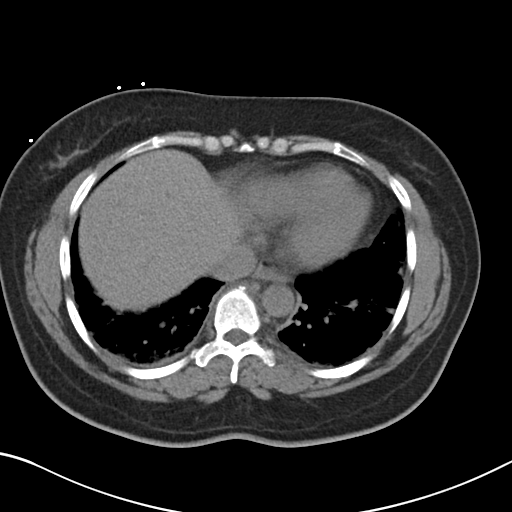
[im 91/95  soft-tissue]
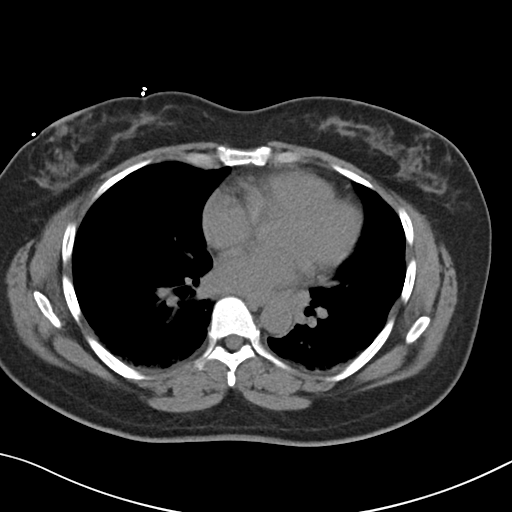

[Series 5: coronal soft tissue · coronal · 0.78mm/px · 3 of 86 slices shown]
[im 29/86  soft-tissue]
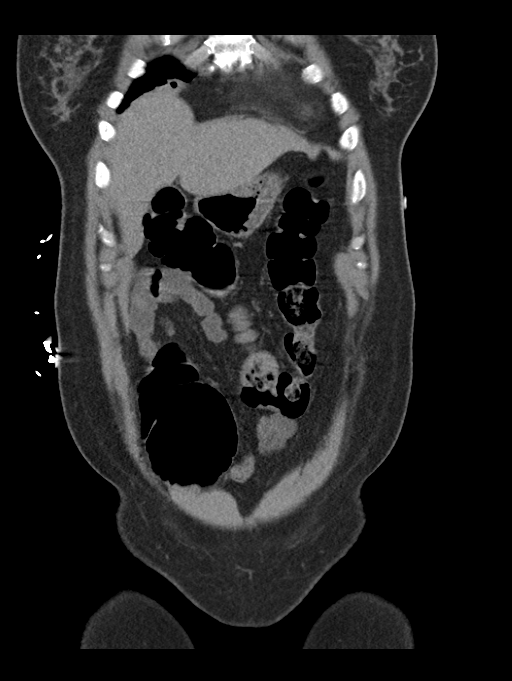
[im 38/86  soft-tissue]
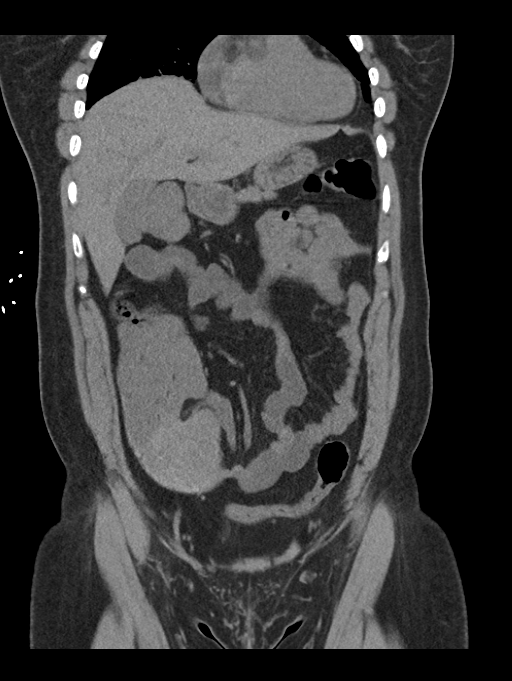
[im 48/86  soft-tissue]
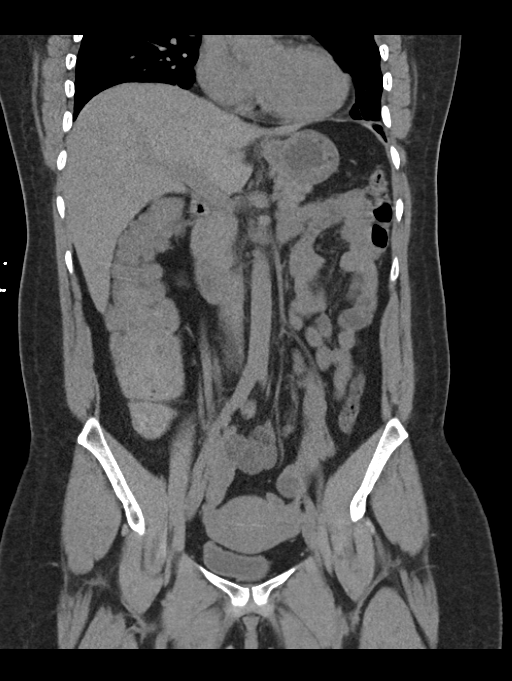

[16 of 46 positions shown; findings below may reference images not displayed]

FINDINGS: Hypoventilatory atelectasis at the lung bases.

The kidneys are symmetric in size without stones or hydronephrosis.
There is no perinephric stranding. Both ureters are decompressed
without stones along the course.

Evaluation of the remaining solid and hollow viscera is limited
given lack of contrast. The unenhanced liver, gallbladder, spleen,
pancreas, and adrenal glands are normal.

Stomach is physiologically distended. There are no dilated or
thickened bowel loops. The appendix is normal. Liquid and solid
stool within the cecum, ascending and proximal transverse colon.
Small amount of solid stool within the descending colon. No colonic
wall thickening or pericolonic inflammatory change. No free air,
free fluid, or intra-abdominal fluid collection.

No retroperitoneal adenopathy. Abdominal aorta is normal in caliber.
Small fat containing umbilical hernia.

Within the pelvis, intrauterine device within the uterus. Right
ovarian cyst measures 2.7 cm, left ovarian cyst measures 1.7 cm. No
pelvic free fluid. There are nabothian cysts in the cervix. Urinary
bladder is minimally distended. No pelvic free fluid. No pelvic
adenopathy.

There are no acute or suspicious osseous abnormalities.
IMPRESSION: 1.  No renal stones or obstructive uropathy.
2. Small bilateral ovarian cysts.
3. Small fat containing umbilical hernia.

## 2017-12-30 ENCOUNTER — Other Ambulatory Visit: Payer: Self-pay | Admitting: Physician Assistant

## 2017-12-30 DIAGNOSIS — Z1231 Encounter for screening mammogram for malignant neoplasm of breast: Secondary | ICD-10-CM

## 2018-01-31 ENCOUNTER — Ambulatory Visit: Payer: Self-pay

## 2018-02-04 IMAGING — DX DG CHEST 2V
2 series · 2 of 2 positions shown · non-contrast
Comparison: Chest CT dated 02/07/2014

CLINICAL DATA: 45-year-old female with chest pain

EXAM:
CHEST  2 VIEW

[chest pa]
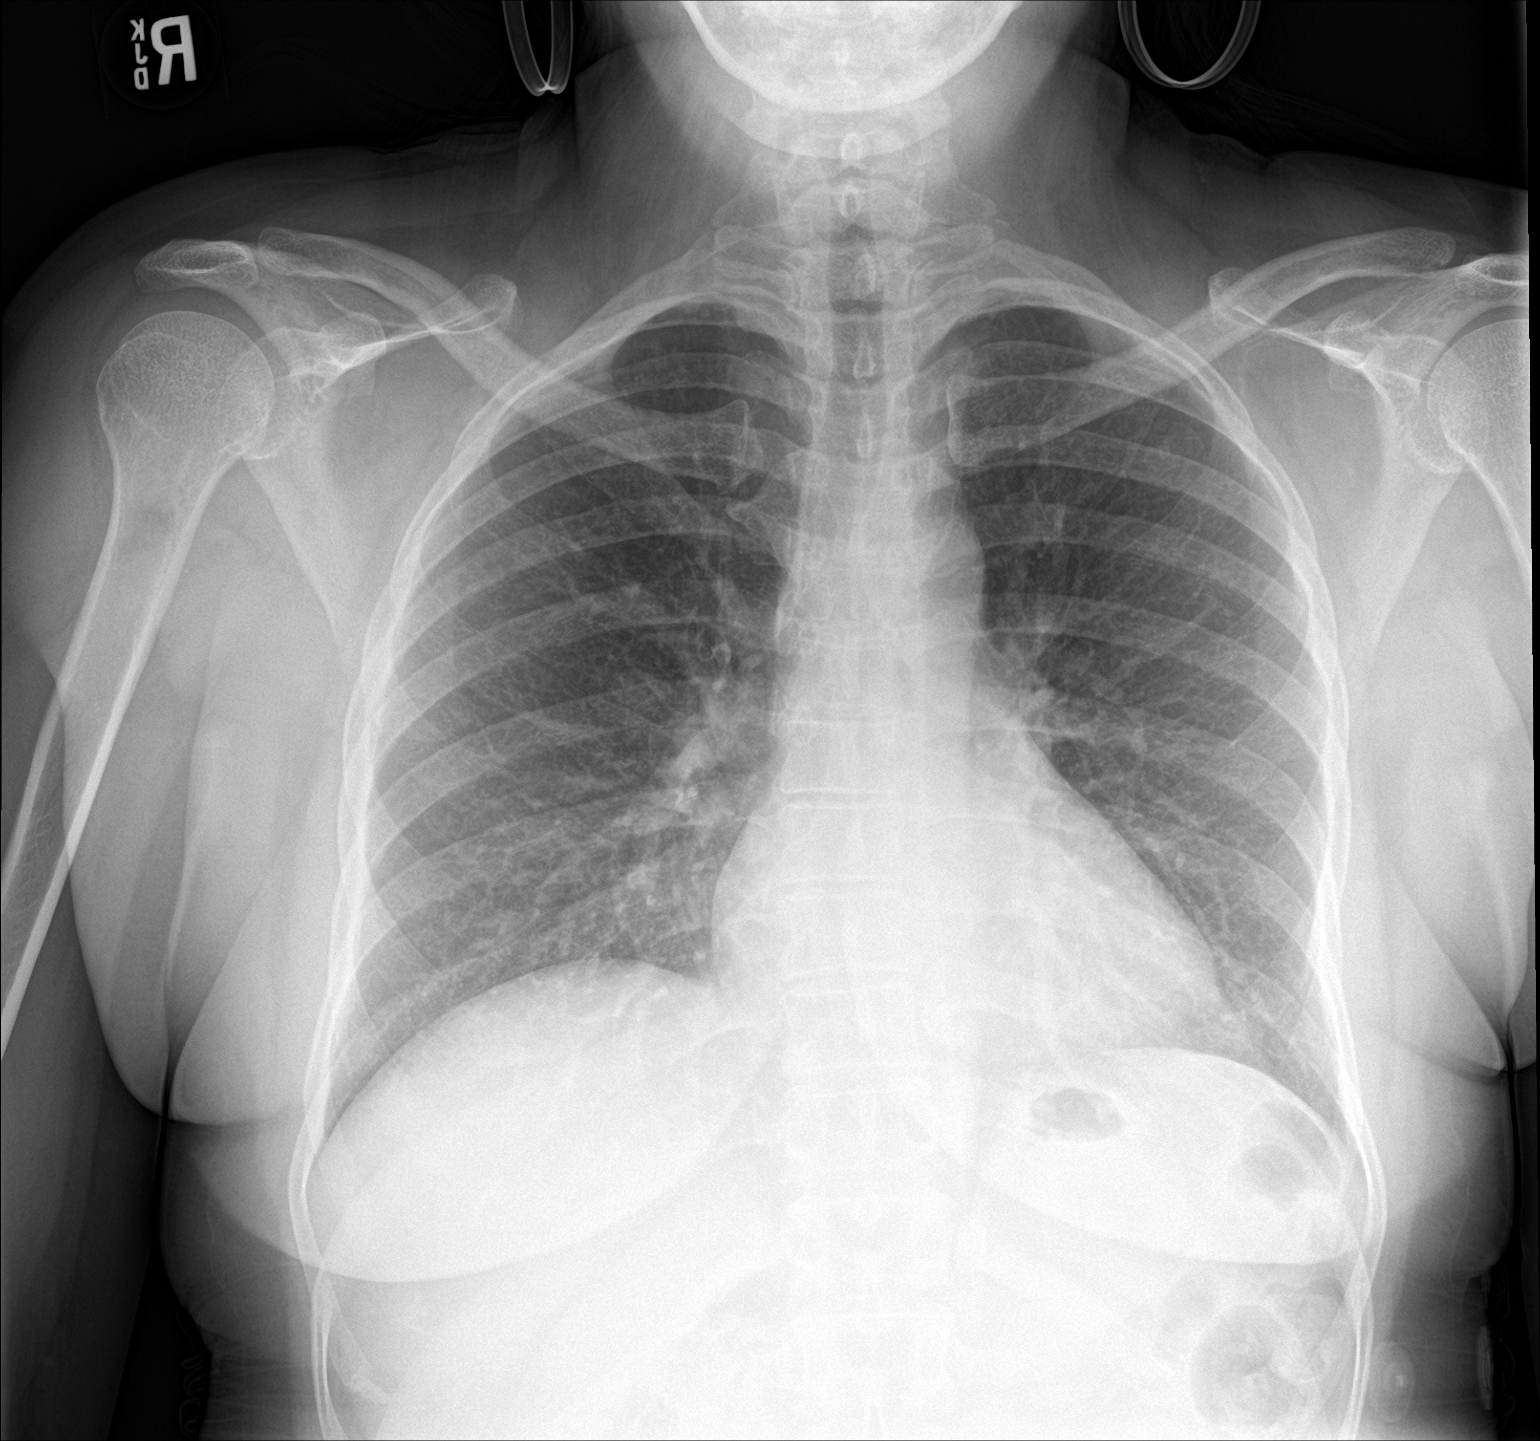

[chest lat]
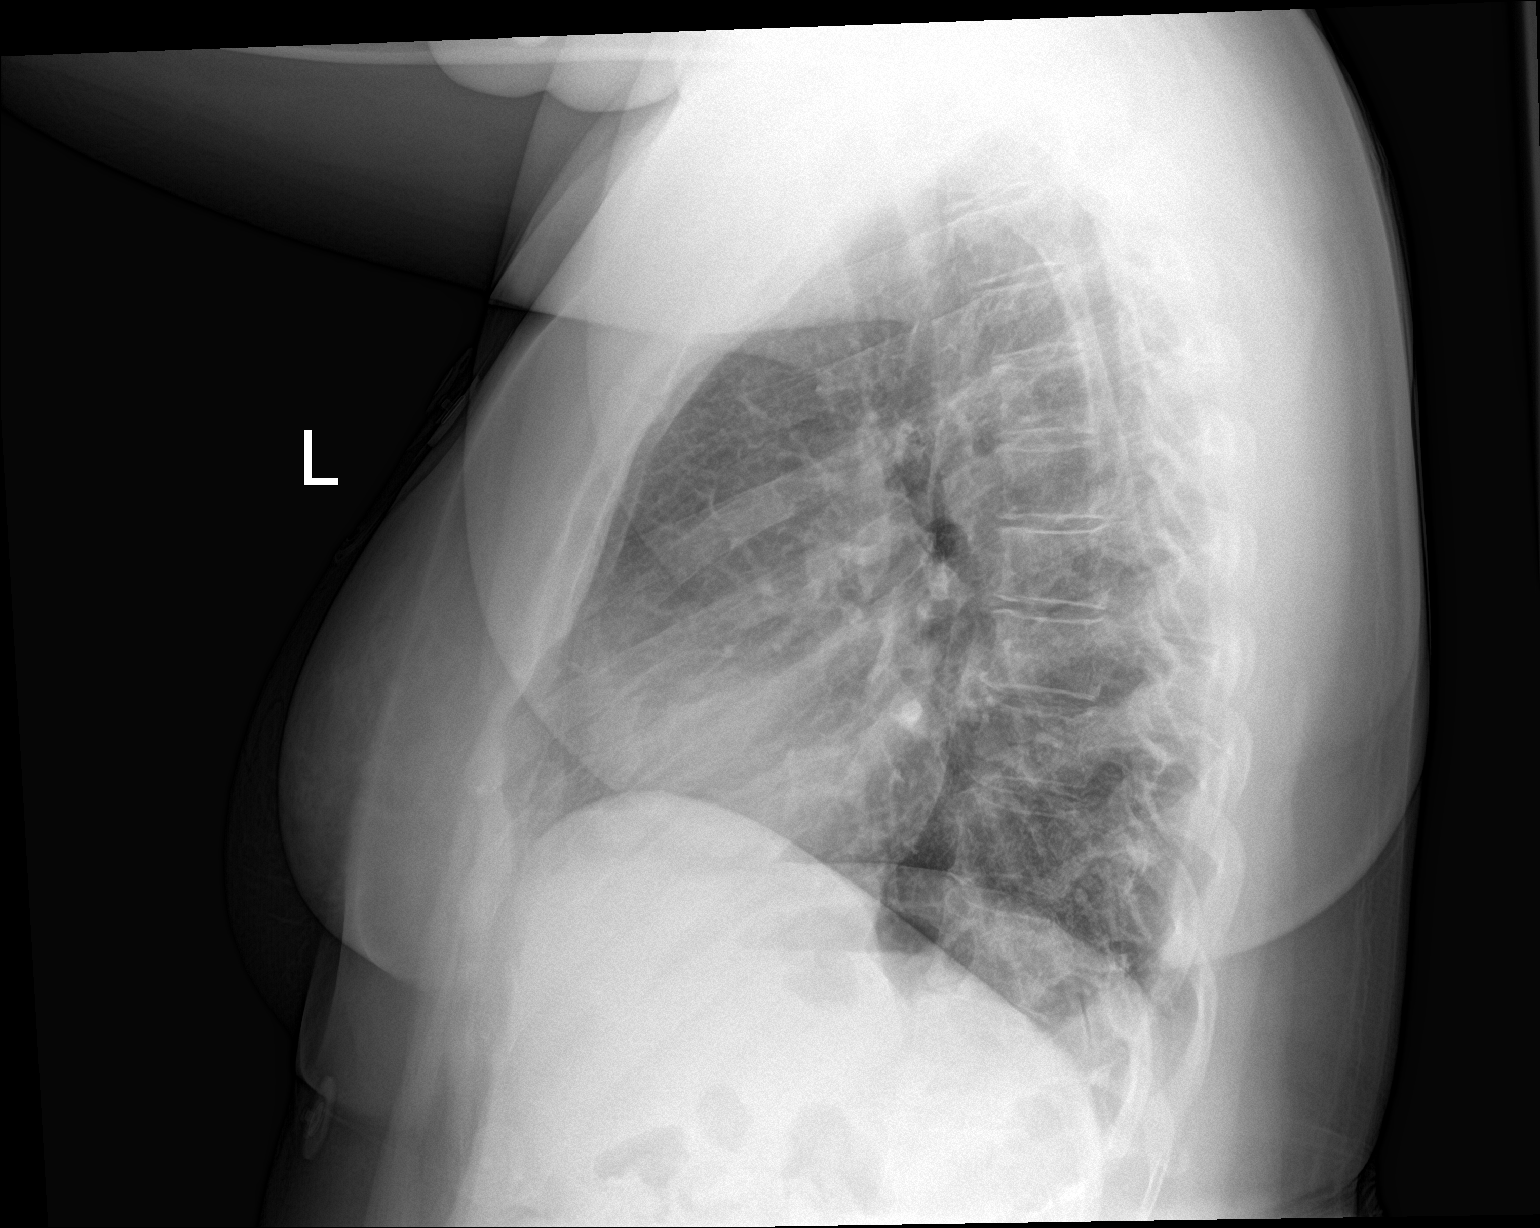

[2 of 2 positions shown; findings below may reference images not displayed]

FINDINGS: The heart size and mediastinal contours are within normal limits.
Both lungs are clear. The visualized skeletal structures are
unremarkable.
IMPRESSION: No active cardiopulmonary disease.

## 2018-10-25 ENCOUNTER — Encounter: Payer: Self-pay | Admitting: Gastroenterology

## 2018-11-15 ENCOUNTER — Telehealth: Payer: Self-pay

## 2018-11-15 NOTE — Telephone Encounter (Signed)
Covid-19 travel screening questions  Have you traveled in the last 14 days? If yes where?  Do you now or have you had a fever in the last 14 days?  Do you have any respiratory symptoms of shortness of breath or cough now or in the last 14 days?  Do you have a medical history of Congestive Heart Failure?  Do you have a medical history of lung disease?  Do you have any family members or close contacts with diagnosed or suspected Covid-19?    I was not able to reach the patient.  I left a detailed message asking if they meet any of the criteria to please call the office at [phone] prior to coming to the appointment.

## 2018-11-15 NOTE — Telephone Encounter (Signed)
Pt returned call and answered "no" to all screening questions.

## 2018-11-16 ENCOUNTER — Ambulatory Visit: Payer: Self-pay | Admitting: Gastroenterology

## 2018-12-30 ENCOUNTER — Ambulatory Visit: Payer: Self-pay | Admitting: Gastroenterology

## 2019-04-11 ENCOUNTER — Emergency Department (HOSPITAL_COMMUNITY)
Admission: EM | Admit: 2019-04-11 | Discharge: 2019-04-11 | Discharge status: Against Medical Advice | Payer: Self-pay | Attending: Emergency Medicine | Admitting: Emergency Medicine

## 2019-04-11 ENCOUNTER — Encounter (HOSPITAL_COMMUNITY): Payer: Self-pay | Admitting: *Deleted

## 2019-04-11 ENCOUNTER — Other Ambulatory Visit: Payer: Self-pay

## 2019-04-11 DIAGNOSIS — Z5321 Procedure and treatment not carried out due to patient leaving prior to being seen by health care provider: Secondary | ICD-10-CM | POA: Insufficient documentation

## 2019-04-11 LAB — URINALYSIS, ROUTINE W REFLEX MICROSCOPIC
Bacteria, UA: NONE SEEN
Bilirubin Urine: NEGATIVE
Glucose, UA: NEGATIVE mg/dL
Ketones, ur: NEGATIVE mg/dL
Nitrite: NEGATIVE
Protein, ur: NEGATIVE mg/dL
Specific Gravity, Urine: 1.008 (ref 1.005–1.030)
pH: 6 (ref 5.0–8.0)

## 2019-04-11 LAB — COMPREHENSIVE METABOLIC PANEL
ALT: 24 U/L (ref 0–44)
AST: 20 U/L (ref 15–41)
Albumin: 4 g/dL (ref 3.5–5.0)
Alkaline Phosphatase: 66 U/L (ref 38–126)
Anion gap: 9 (ref 5–15)
BUN: 14 mg/dL (ref 6–20)
CO2: 24 mmol/L (ref 22–32)
Calcium: 9.5 mg/dL (ref 8.9–10.3)
Chloride: 106 mmol/L (ref 98–111)
Creatinine, Ser: 0.74 mg/dL (ref 0.44–1.00)
GFR calc Af Amer: >60 mL/min (ref >60)
GFR calc non Af Amer: >60 mL/min (ref >60)
Glucose, Bld: 97 mg/dL (ref 70–99)
Potassium: 3.6 mmol/L (ref 3.5–5.1)
Sodium: 139 mmol/L (ref 135–145)
Total Bilirubin: 0.5 mg/dL (ref 0.3–1.2)
Total Protein: 7 g/dL (ref 6.5–8.1)

## 2019-04-11 LAB — CBC
HCT: 38.7 % (ref 36.0–46.0)
Hemoglobin: 13.1 g/dL (ref 12.0–15.0)
MCH: 31.7 pg (ref 26.0–34.0)
MCHC: 33.9 g/dL (ref 30.0–36.0)
MCV: 93.7 fL (ref 80.0–100.0)
Platelets: 227 10*3/uL (ref 150–400)
RBC: 4.13 MIL/uL (ref 3.87–5.11)
RDW: 14.3 % (ref 11.5–15.5)
WBC: 8.9 10*3/uL (ref 4.0–10.5)
nRBC: 0 % (ref 0.0–0.2)

## 2019-04-11 LAB — LIPASE, BLOOD: Lipase: 29 U/L (ref 11–51)

## 2019-04-11 LAB — I-STAT BETA HCG BLOOD, ED (MC, WL, AP ONLY): I-stat hCG, quantitative: <5 m[IU]/mL (ref <5)

## 2019-04-11 MED ORDER — SODIUM CHLORIDE 0.9% FLUSH: 3.0000 mL | Freq: Once | INTRAVENOUS | Status: DC

## 2019-04-11 NOTE — ED Notes (Signed)
Patient called x3 with no response °

## 2019-04-11 NOTE — ED Notes (Signed)
Pt called for vitals with no answer.

## 2019-04-11 NOTE — ED Triage Notes (Signed)
Pt started having L lower back pain wrapping into LLQ around noon today. Took Gas x without relief. Reports nausea as well

## 2021-03-20 ENCOUNTER — Encounter: Payer: Self-pay | Admitting: Gastroenterology

## 2021-04-21 ENCOUNTER — Ambulatory Visit (AMBULATORY_SURGERY_CENTER): Payer: BC Managed Care – PPO

## 2021-04-21 VITALS — Ht 64.0 in | Wt 196.0 lb

## 2021-04-21 DIAGNOSIS — Z1211 Encounter for screening for malignant neoplasm of colon: Secondary | ICD-10-CM

## 2021-04-21 DIAGNOSIS — T7840XA Allergy, unspecified, initial encounter: Secondary | ICD-10-CM

## 2021-04-21 HISTORY — DX: Allergy, unspecified, initial encounter: T78.40XA

## 2021-04-21 MED ORDER — PLENVU 140 G PO SOLR: 1.0000 | Freq: Once | ORAL | 0 refills | Status: AC

## 2021-04-21 NOTE — Progress Notes (Signed)
No egg or soy allergy known to patient  No issues with past sedation with any surgeries or procedures Patient denies ever being told they had issues or difficulty with intubation  No FH of Malignant Hyperthermia No diet pills per patient No home 02 use per patient  No blood thinners per patient  Pt denies issues with constipation  No A fib or A flutter  EMMI video to pt or via MyChart  COVID 19 guidelines implemented in PV today with Pt and RN  Pt is fully vaccinated  for Covid   Plenvu Coupon given to pt in PV today , Code to Pharmacy and  NO PA's for preps discussed with pt In PV today  Discussed with pt there will be an out-of-pocket cost for prep and that varies from $0 to 70 dollars   Due to the COVID-19 pandemic we are asking patients to follow certain guidelines.  Pt aware of COVID protocols and [facility] guidelines   

## 2021-05-06 ENCOUNTER — Telehealth: Payer: Self-pay | Admitting: Gastroenterology

## 2021-05-06 DIAGNOSIS — Z1211 Encounter for screening for malignant neoplasm of colon: Secondary | ICD-10-CM

## 2021-05-06 MED ORDER — CLENPIQ 10-3.5-12 MG-GM -GM/160ML PO SOLN: 1.0000 | ORAL | 0 refills | Status: DC

## 2021-05-06 NOTE — Telephone Encounter (Signed)
Clenpiq appears to be covered by insurance- sent in prep to [address]   [employer] instructions to pt as her procedure is Thursday this week

## 2021-05-06 NOTE — Telephone Encounter (Signed)
Sent instructions to her e mail per pt's request [email or web address]  Pt will call if other issues with prep- PV was 8-22 and when pt went to get prep was told no script as it was > 7 days after PV - pt was waiting on coupon

## 2021-05-08 ENCOUNTER — Ambulatory Visit (AMBULATORY_SURGERY_CENTER): Payer: BC Managed Care – PPO | Admitting: Gastroenterology

## 2021-05-08 ENCOUNTER — Other Ambulatory Visit: Payer: Self-pay

## 2021-05-08 ENCOUNTER — Encounter: Payer: Self-pay | Admitting: Gastroenterology

## 2021-05-08 VITALS — BP 91/54 | HR 77 | Temp 96.8°F | Resp 20 | Ht 64.0 in | Wt 196.0 lb

## 2021-05-08 DIAGNOSIS — D127 Benign neoplasm of rectosigmoid junction: Secondary | ICD-10-CM | POA: Diagnosis not present

## 2021-05-08 DIAGNOSIS — D124 Benign neoplasm of descending colon: Secondary | ICD-10-CM

## 2021-05-08 DIAGNOSIS — D128 Benign neoplasm of rectum: Secondary | ICD-10-CM | POA: Diagnosis not present

## 2021-05-08 DIAGNOSIS — D125 Benign neoplasm of sigmoid colon: Secondary | ICD-10-CM

## 2021-05-08 DIAGNOSIS — Z1211 Encounter for screening for malignant neoplasm of colon: Secondary | ICD-10-CM | POA: Diagnosis not present

## 2021-05-08 MED ORDER — SODIUM CHLORIDE 0.9 % IV SOLN: 500.0000 mL | Freq: Once | INTRAVENOUS | Status: AC

## 2021-05-08 NOTE — Patient Instructions (Signed)
Resume previous diet and medications. °Awaiting pathology results. Repeat Colonoscopy date to be determined based on pathology results. ° °YOU HAD AN ENDOSCOPIC PROCEDURE TODAY AT THE [facility]:   Refer to the procedure report that was given to you for any specific questions about what was found during the examination.  If the procedure report does not answer your questions, please call your gastroenterologist to clarify.  If you requested that your care partner not be given the details of your procedure findings, then the procedure report has been included in a sealed envelope for you to review at your convenience later. ° °YOU SHOULD EXPECT: Some feelings of bloating in the abdomen. Passage of more gas than usual.  Walking can help get rid of the air that was put into your GI tract during the procedure and reduce the bloating. If you had a lower endoscopy (such as a colonoscopy or flexible sigmoidoscopy) you may notice spotting of blood in your stool or on the toilet paper. If you underwent a bowel prep for your procedure, you may not have a normal bowel movement for a few days. ° °Please Note:  You might notice some irritation and congestion in your nose or some drainage.  This is from the oxygen used during your procedure.  There is no need for concern and it should clear up in a day or so. ° °SYMPTOMS TO REPORT IMMEDIATELY: ° °Following lower endoscopy (colonoscopy or flexible sigmoidoscopy): ° Excessive amounts of blood in the stool ° Significant tenderness or worsening of abdominal pains ° Swelling of the abdomen that is new, acute ° Fever of 100°F or higher ° °For urgent or emergent issues, a gastroenterologist can be reached at any hour by calling [phone]. °Do not use MyChart messaging for urgent concerns.  ° ° °DIET:  We do recommend a small meal at first, but then you may proceed to your regular diet.  Drink plenty of fluids but you should avoid alcoholic beverages for 24  hours. ° °ACTIVITY:  You should plan to take it easy for the rest of today and you should NOT DRIVE or use heavy machinery until tomorrow (because of the sedation medicines used during the test).   ° °FOLLOW UP: °Our staff will call the number listed on your records 48-72 hours following your procedure to check on you and address any questions or concerns that you may have regarding the information given to you following your procedure. If we do not reach you, we will leave a message.  We will attempt to reach you two times.  During this call, we will ask if you have developed any symptoms of COVID 19. If you develop any symptoms (ie: fever, flu-like symptoms, shortness of breath, cough etc.) before then, please call [phone].  If you test positive for Covid 19 in the 2 weeks post procedure, please call and report this information to us.   ° °If any biopsies were taken you will be contacted by phone or by letter within the next 1-3 weeks.  Please call us at [phone] if you have not heard about the biopsies in 3 weeks.  ° ° °SIGNATURES/CONFIDENTIALITY: °You and/or your care partner have signed paperwork which will be entered into your electronic medical record.  These signatures attest to the fact that that the information above on your After Visit Summary has been reviewed and is understood.  Full responsibility of the confidentiality of this discharge information lies with you and/or your care-partner.  °

## 2021-05-08 NOTE — Op Note (Signed)
[facility] Patient Name: [name] Procedure Date: 05/08/2021 11:42 AM MRN: [id number] Endoscopist: Nicki Reaper E. Candis Schatz , MD Age: 50 Referring MD:  Date of Birth: 1971-03-11 Gender: Female Account #: [id number] Procedure:                Colonoscopy Indications:              Screening for colorectal malignant neoplasm, This                            is the patient's first colonoscopy Medicines:                Monitored Anesthesia Care Procedure:                Pre-Anesthesia Assessment:                           - Prior to the procedure, a History and Physical                            was performed, and patient medications and                            allergies were reviewed. The patient's tolerance of                            previous anesthesia was also reviewed. The risks                            and benefits of the procedure and the sedation                            options and risks were discussed with the patient.                            All questions were answered, and informed consent                            was obtained. Prior Anticoagulants: The patient has                            taken no previous anticoagulant or antiplatelet                            agents. ASA Grade Assessment: II - A patient with                            mild systemic disease. After reviewing the risks                            and benefits, the patient was deemed in                            satisfactory condition to undergo the procedure.  After obtaining informed consent, the colonoscope                            was passed under direct vision. Throughout the                            procedure, the patient's blood pressure, pulse, and                            oxygen saturations were monitored continuously. The                            CF [id number] was introduced through the anus                            and advanced to  the the terminal ileum, with                            identification of the appendiceal orifice and IC                            valve. The colonoscopy was performed without                            difficulty. The patient tolerated the procedure                            well. The quality of the bowel preparation was                            adequate. The terminal ileum, ileocecal valve,                            appendiceal orifice, and rectum were photographed. Scope In: 11:50:03 AM Scope Out: 12:09:20 PM Scope Withdrawal Time: 0 hours 16 minutes 43 seconds  Total Procedure Duration: 0 hours 19 minutes 17 seconds  Findings:                 The perianal and digital rectal examinations were                            normal. Pertinent negatives include normal                            sphincter tone and no palpable rectal lesions.                           A 3 mm polyp was found in the descending colon. The                            polyp was flat. Polypectomy was attempted,                            initially using a cold snare. Polyp  resection was                            incomplete with this device, due to the snare                            repeatedly slipping over the polyp. This                            intervention then required a different device and                            polypectomy technique. The polyp was then removed                            with a cold biopsy forceps. Resection and retrieval                            were complete.                           Multiple sessile polyps were found in the sigmoid                            colon. The polyps were 1 to 3 mm in size. Three of                            these polyps were removed with a cold snare.                            Resection and retrieval were complete. Estimated                            blood loss was minimal.                           Many sessile polyps were found in the rectum.  The                            polyps were small in size. Two of these polyps were                            removed with a cold biopsy forceps. Resection and                            retrieval were complete. Estimated blood loss was                            minimal.                           Multiple small-mouthed diverticula were found in  the sigmoid colon and descending colon.                           The exam was otherwise normal throughout the                            examined colon.                           The terminal ileum appeared normal.                           The retroflexed view of the distal rectum and anal                            verge was normal and showed no anal or rectal                            abnormalities. Complications:            No immediate complications. Estimated Blood Loss:     Estimated blood loss was minimal. Impression:               - One 3 mm polyp in the descending colon, removed                            with a cold biopsy forceps. Resected and retrieved.                           - Multiple 1 to 3 mm polyps in the sigmoid colon,                            removed with a cold snare. Resected and retrieved.                           - Many small polyps in the rectum, removed with a                            cold biopsy forceps. Resected and retrieved.                           - Diverticulosis in the sigmoid colon and in the                            descending colon.                           - The examined portion of the ileum was normal.                           - The distal rectum and anal verge are normal on                            retroflexion view. Recommendation:           -  Patient has a contact number available for                            emergencies. The signs and symptoms of potential                            delayed complications were discussed with the                             patient. Return to normal activities tomorrow.                            Written discharge instructions were provided to the                            patient.                           - Resume previous diet.                           - Continue present medications.                           - Await pathology results.                           - Repeat colonoscopy (date not yet determined) for                            surveillance based on pathology results. Laquetta Racey E. Candis Schatz, MD 05/08/2021 12:16:03 PM This report has been signed electronically.

## 2021-05-08 NOTE — Progress Notes (Signed)
VS completed by DT.  Pt's states no medical or surgical changes since previsit or office visit.  

## 2021-05-08 NOTE — Progress Notes (Signed)
[facility] History and Physical   Primary Care Physician:  Everardo Beals, NP   Reason for Procedure:   Colon cancer screening  Plan:    Screening colonoscopy     HPI: [name] is a 50 y.o. female undergoing initial average risk screening colonoscopy.  She has no chronic GI symptoms and no family history of colon cancer.   Past Medical History:  Diagnosis Date   Allergy 04/21/2021   seasonal   Anemia    Anxiety    Depression    Hepatic steatosis    Hyperlipidemia    Pre-diabetes     Past Surgical History:  Procedure Laterality Date   MULTIPLE TOOTH EXTRACTIONS Bilateral    1990's   no history of intubation      Prior to Admission medications   Medication Sig Start Date End Date Taking? Authorizing Provider  acetaminophen (TYLENOL) 325 MG tablet Take 650 mg by mouth every 6 (six) hours as needed.    [provider]  hydrOXYzine (ATARAX/VISTARIL) 25 MG tablet Take 1 tablet (25 mg total) by mouth every 4 (four) hours as needed for itching. 09/04/15   Tanna Furry, MD  predniSONE (DELTASONE) 20 MG tablet Take 1 tablet (20 mg total) by mouth 2 (two) times daily with a meal. 09/04/15   Tanna Furry, MD  traMADol (ULTRAM) 50 MG tablet Take 1 tablet (50 mg total) by mouth every 12 (twelve) hours as needed for severe pain. 05/15/15   Everlene Balls, MD    Current Outpatient Medications  Medication Sig Dispense Refill   acetaminophen (TYLENOL) 325 MG tablet Take 650 mg by mouth every 6 (six) hours as needed.     hydrOXYzine (ATARAX/VISTARIL) 25 MG tablet Take 1 tablet (25 mg total) by mouth every 4 (four) hours as needed for itching. 12 tablet 0   predniSONE (DELTASONE) 20 MG tablet Take 1 tablet (20 mg total) by mouth 2 (two) times daily with a meal. 10 tablet 0   traMADol (ULTRAM) 50 MG tablet Take 1 tablet (50 mg total) by mouth every 12 (twelve) hours as needed for severe pain. 10 tablet 0   Current Facility-Administered Medications   Medication Dose Route Frequency Provider Last Rate Last Admin   0.9 %  sodium chloride infusion  500 mL Intravenous Once Daryel November, MD        Allergies as of 05/08/2021 - Review Complete 05/08/2021  Allergen Reaction Noted   Paxil [paroxetine] Rash 04/21/2021   Morphine and related Nausea And Vomiting 04/21/2021    Family History  Problem Relation Age of Onset   Colon cancer Neg Hx    Colon polyps Neg Hx    Esophageal cancer Neg Hx    Rectal cancer Neg Hx    Stomach cancer Neg Hx     Social History   Socioeconomic History   Marital status: Divorced    Spouse name: Not on file   Number of children: Not on file   Years of education: Not on file   Highest education level: Not on file  Occupational History   Not on file  Tobacco Use   Smoking status: Every Day    Packs/day: 1.00    Types: Cigarettes   Smokeless tobacco: Never  Vaping Use   Vaping Use: Never used  Substance and Sexual Activity   Alcohol use: Yes    Comment: occasional, less than monthly   Drug use: No   Sexual activity: Yes    Birth control/protection: Post-menopausal  Other Topics Concern   Not on file  Social History Narrative   Not on file   Social Determinants of Health   Financial Resource Strain: Not on file  Food Insecurity: Not on file  Transportation Needs: Not on file  Physical Activity: Not on file  Stress: Not on file  Social Connections: Not on file  Intimate Partner Violence: Not on file    Review of Systems:  All other review of systems negative except as mentioned in the HPI.  Physical Exam: Vital signs BP 112/72   Pulse 81   Temp (!) 96.8 F (36 C)   Ht '5\' 4"'$  (1.626 m)   Wt 196 lb (88.9 kg)   LMP 03/20/2013   SpO2 100%   BMI 33.64 kg/m   General:   Alert,  Well-developed, well-nourished Af Am female, pleasant and cooperative in NAD Lungs:  Clear throughout to auscultation.   Heart:  Regular rate and rhythm; no murmurs, clicks, rubs,  or  gallops. Abdomen:  Soft, nontender and nondistended. Normal bowel sounds.   Neuro/Psych:  Normal mood and affect. A and O x 3   Yessica Putnam E. Candis Schatz, MD [facility]

## 2021-05-08 NOTE — Progress Notes (Signed)
To pacu, VSS. Report to RN.tb 

## 2021-05-12 ENCOUNTER — Telehealth: Payer: Self-pay | Admitting: *Deleted

## 2021-05-12 NOTE — Telephone Encounter (Signed)
  Follow up Call-  Call back number 05/08/2021  Post procedure Call Back phone  # [phone]  Permission to leave phone message Yes  Some recent data might be hidden     Patient questions:  Do you have a fever, pain , or abdominal swelling? No. Pain Score  0 *  Have you tolerated food without any problems? Yes.    Have you been able to return to your normal activities? Yes.    Do you have any questions about your discharge instructions: Diet   No. Medications  No. Follow up visit  No.  Do you have questions or concerns about your Care? No.  Actions: * If pain score is 4 or above: No action needed, pain <4.Have you developed a fever since your procedure? no  2.   Have you had an respiratory symptoms (SOB or cough) since your procedure? no  3.   Have you tested positive for COVID 19 since your procedure no  4.   Have you had any family members/close contacts diagnosed with the COVID 19 since your procedure?  no   If yes to any of these questions please route to Joylene John, RN and Joella Prince, RN

## 2021-05-14 ENCOUNTER — Encounter: Payer: Self-pay | Admitting: Gastroenterology

## 2024-06-09 ENCOUNTER — Ambulatory Visit (HOSPITAL_COMMUNITY)
Admission: EM | Admit: 2024-06-09 | Discharge: 2024-06-09 | Disposition: A | Discharge status: Home / Self Care | Attending: Family Medicine | Admitting: Family Medicine

## 2024-06-09 ENCOUNTER — Encounter (HOSPITAL_COMMUNITY): Payer: Self-pay

## 2024-06-09 DIAGNOSIS — L0291 Cutaneous abscess, unspecified: Secondary | ICD-10-CM

## 2024-06-09 MED ORDER — SULFAMETHOXAZOLE-TRIMETHOPRIM 800-160 MG PO TABS: 1.0000 | ORAL_TABLET | Freq: Two times a day (BID) | ORAL | 0 refills | Status: AC

## 2024-06-09 NOTE — ED Provider Notes (Signed)
 [facility]    CSN: [id number] Arrival date & time: 06/09/24  1149      History   Chief Complaint Chief Complaint  Patient presents with   Mass    HPI RYELEIGH SANTORE is a 53 y.o. female.   Patient is here for a few knots in her right inner thigh.  She thought at first was due to shaving.  Started to hurt about 2 weeks ago.  She used cornstarch without help.  It then felt like it burst and used neosporin, and then A&D ointment.  It can be painful with certain movements, etc.        Past Medical History:  Diagnosis Date   Allergy 04/21/2021   seasonal   Anemia    Anxiety    Depression    Hepatic steatosis    Hyperlipidemia    Pre-diabetes     There are no active problems to display for this patient.   Past Surgical History:  Procedure Laterality Date   MULTIPLE TOOTH EXTRACTIONS Bilateral    1990's   no history of intubation      OB History   No obstetric history on file.      Home Medications    Prior to Admission medications   Not on File    Family History Family History  Problem Relation Age of Onset   Colon cancer Neg Hx    Colon polyps Neg Hx    Esophageal cancer Neg Hx    Rectal cancer Neg Hx    Stomach cancer Neg Hx     Social History Social History   Tobacco Use   Smoking status: Every Day    Current packs/day: 1.00    Types: Cigarettes   Smokeless tobacco: Never  Vaping Use   Vaping status: Never Used  Substance Use Topics   Alcohol use: Yes    Comment: occasional, less than monthly   Drug use: No     Allergies   Paxil [paroxetine] and Morphine  and codeine   Review of Systems Review of Systems  Constitutional: Negative.   HENT: Negative.    Respiratory: Negative.    Cardiovascular: Negative.   Gastrointestinal: Negative.   Genitourinary: Negative.   Musculoskeletal: Negative.   Psychiatric/Behavioral: Negative.       Physical Exam Triage Vital Signs ED Triage Vitals  Encounter  Vitals Group     BP 06/09/24 1207 106/68     Girls Systolic BP Percentile --      Girls Diastolic BP Percentile --      Boys Systolic BP Percentile --      Boys Diastolic BP Percentile --      Pulse Rate 06/09/24 1207 91     Resp 06/09/24 1207 16     Temp 06/09/24 1207 98 F (36.7 C)     Temp Source 06/09/24 1207 Oral     SpO2 06/09/24 1207 94 %     Weight --      Height --      Head Circumference --      Peak Flow --      Pain Score 06/09/24 1206 6     Pain Loc --      Pain Education --      Exclude from Growth Chart --    No data found.  Updated Vital Signs BP 106/68 (BP Location: Left Arm)   Pulse 91   Temp 98 F (36.7 C) (Oral)   Resp 16   LMP  03/20/2013   SpO2 94%   Visual Acuity Right Eye Distance:   Left Eye Distance:   Bilateral Distance:    Right Eye Near:   Left Eye Near:    Bilateral Near:     Physical Exam Constitutional:      Appearance: Normal appearance.  Cardiovascular:     Rate and Rhythm: Normal rate and regular rhythm.  Pulmonary:     Breath sounds: Normal breath sounds.  Skin:    Comments: To the right medial upper thigh are several small tender areas under the skin;  no erythema or raised bumps to the surface;  no enlarged LNs  Neurological:     General: No focal deficit present.     Mental Status: She is alert.  Psychiatric:        Mood and Affect: Mood normal.      UC Treatments / Results  Labs (all labs ordered are listed, but only abnormal results are displayed) Labs Reviewed - No data to display  EKG   Radiology No results found.  Procedures Procedures (including critical care time)  Medications Ordered in UC Medications - No data to display  Initial Impression / Assessment and Plan / UC Course  I have reviewed the triage vital signs and the nursing notes.  Pertinent labs & imaging results that were available during my care of the patient were reviewed by me and considered in my medical decision making (see chart  for details).   Final Clinical Impressions(s) / UC Diagnoses   Final diagnoses:  Abscess     Discharge Instructions      You were seen today for several small abscesses to the thigh.  I have sent out an oral antibiotic for you today.  You may use warm compresses to the area as well.  Please return if not improving or worsening despite treatment.     ED Prescriptions     Medication Sig Dispense Auth. Provider   sulfamethoxazole-trimethoprim (BACTRIM DS) 800-160 MG tablet Take 1 tablet by mouth 2 (two) times daily for 7 days. 14 tablet Darral Longs, MD      PDMP not reviewed this encounter.   Darral Longs, MD 06/09/24 1236

## 2024-06-09 NOTE — Discharge Instructions (Signed)
 You were seen today for several small abscesses to the thigh.  I have sent out an oral antibiotic for you today.  You may use warm compresses to the area as well.  Please return if not improving or worsening despite treatment.

## 2024-06-09 NOTE — ED Triage Notes (Signed)
 Noticed painful nodules/masses on the inner thighs. States one had burst. States they feel like small knots. States they have been off and on since she gained wait, current flare 1.5 weeks ago.   Tried Neosporin and A&D ointment with no relief.

## 2024-06-15 ENCOUNTER — Emergency Department (HOSPITAL_COMMUNITY)

## 2024-06-15 ENCOUNTER — Encounter (HOSPITAL_COMMUNITY): Payer: Self-pay | Admitting: Emergency Medicine

## 2024-06-15 ENCOUNTER — Other Ambulatory Visit: Payer: Self-pay

## 2024-06-15 ENCOUNTER — Emergency Department (HOSPITAL_COMMUNITY)
Admission: EM | Admit: 2024-06-15 | Discharge: 2024-06-15 | Disposition: A | Discharge status: Home / Self Care | Attending: Emergency Medicine | Admitting: Emergency Medicine

## 2024-06-15 DIAGNOSIS — R0789 Other chest pain: Secondary | ICD-10-CM | POA: Insufficient documentation

## 2024-06-15 DIAGNOSIS — R079 Chest pain, unspecified: Secondary | ICD-10-CM

## 2024-06-15 LAB — COMPREHENSIVE METABOLIC PANEL WITH GFR
ALT: 19 U/L (ref 0–44)
AST: 19 U/L (ref 15–41)
Albumin: 4 g/dL (ref 3.5–5.0)
Alkaline Phosphatase: 77 U/L (ref 38–126)
Anion gap: 10 (ref 5–15)
BUN: 16 mg/dL (ref 6–20)
CO2: 22 mmol/L (ref 22–32)
Calcium: 9.2 mg/dL (ref 8.9–10.3)
Chloride: 105 mmol/L (ref 98–111)
Creatinine, Ser: 0.96 mg/dL (ref 0.44–1.00)
GFR, Estimated: >60 mL/min (ref >60)
Glucose, Bld: 115 mg/dL — ABNORMAL HIGH (ref 70–99)
Potassium: 4.1 mmol/L (ref 3.5–5.1)
Sodium: 137 mmol/L (ref 135–145)
Total Bilirubin: 0.5 mg/dL (ref 0.0–1.2)
Total Protein: 7.2 g/dL (ref 6.5–8.1)

## 2024-06-15 LAB — URINALYSIS, ROUTINE W REFLEX MICROSCOPIC
Bacteria, UA: NONE SEEN
Bilirubin Urine: NEGATIVE
Glucose, UA: NEGATIVE mg/dL
Hgb urine dipstick: NEGATIVE
Ketones, ur: NEGATIVE mg/dL
Nitrite: NEGATIVE
Protein, ur: NEGATIVE mg/dL
Specific Gravity, Urine: 1.002 — ABNORMAL LOW (ref 1.005–1.030)
pH: 6 (ref 5.0–8.0)

## 2024-06-15 LAB — CBC
HCT: 39.2 % (ref 36.0–46.0)
Hemoglobin: 13.5 g/dL (ref 12.0–15.0)
MCH: 32.1 pg (ref 26.0–34.0)
MCHC: 34.4 g/dL (ref 30.0–36.0)
MCV: 93.1 fL (ref 80.0–100.0)
Platelets: 217 K/uL (ref 150–400)
RBC: 4.21 MIL/uL (ref 3.87–5.11)
RDW: 14.6 % (ref 11.5–15.5)
WBC: 6.4 K/uL (ref 4.0–10.5)
nRBC: 0 % (ref 0.0–0.2)

## 2024-06-15 LAB — TROPONIN I (HIGH SENSITIVITY): Troponin I (High Sensitivity): 3 ng/L (ref <18)

## 2024-06-15 LAB — D-DIMER, QUANTITATIVE: D-Dimer, Quant: 0.35 ug{FEU}/mL (ref 0.00–0.50)

## 2024-06-15 MED ORDER — KETOROLAC TROMETHAMINE 15 MG/ML IJ SOLN
15.0000 mg | Freq: Once | INTRAMUSCULAR | Status: AC
  Administered 2024-06-15: 15 mg via INTRAVENOUS
  Filled 2024-06-15: qty 1

## 2024-06-15 MED ORDER — FAMOTIDINE 20 MG PO TABS
20.0000 mg | ORAL_TABLET | Freq: Once | ORAL | Status: AC
  Administered 2024-06-15: 20 mg via ORAL
  Filled 2024-06-15: qty 1

## 2024-06-15 MED ORDER — FAMOTIDINE 20 MG PO TABS: 20.0000 mg | ORAL_TABLET | Freq: Two times a day (BID) | ORAL | 0 refills | Status: AC

## 2024-06-15 MED ORDER — ALUM & MAG HYDROXIDE-SIMETH 200-200-20 MG/5ML PO SUSP
30.0000 mL | Freq: Once | ORAL | Status: AC
  Administered 2024-06-15: 30 mL via ORAL
  Filled 2024-06-15: qty 30

## 2024-06-15 MED ORDER — LIDOCAINE VISCOUS HCL 2 % MT SOLN
15.0000 mL | Freq: Once | OROMUCOSAL | Status: AC
  Administered 2024-06-15: 15 mL via ORAL
  Filled 2024-06-15: qty 15

## 2024-06-15 NOTE — ED Provider Notes (Signed)
 [place] EMERGENCY DEPARTMENT AT [facility] Provider Note   CSN: [id number] Arrival date & time: 06/15/24  9453     Patient presents with: Chest Pain   [name] is a 53 y.o. female who presents to the emergency department with a chief complaint of chest pain.  Patient states that she has been experiencing left-sided episodic chest pressure as well as pain which she describes as pressure as well as gas-like or acid reflux. Patient denies shortness of breath but states she is experiencing worsening chest pain when taking a deep breath. Patient states that during these episodes she is also experiencing bilateral arm tingling. Of note patient recently seen at [facility] on 10/10 for groin abscesses and prescribed Bactrim, patient states that these areas are well-healing and currently painless, states she has one more day of Bactrim until completing the course. Denies fever, chills, shortness of breath, nausea, vomiting, abdominal pain, weakness. Denies facial droop, slurred speech, visual disturbances, unilateral weakness. Denies history of DVT or PE, recent long travel, hormone replacement, recent surgery/trauma. Past medical history significant for Hyperlipidemia but denies any prescription medications at home.  Chest pain is nonexertional.  Patient states she has never seen a cardiologist.    Chest Pain      Prior to Admission medications   Medication Sig Start Date End Date Taking? Authorizing Provider  famotidine (PEPCID) 20 MG tablet Take 1 tablet (20 mg total) by mouth 2 (two) times daily. 06/15/24  Yes Labrandon Knoch F, PA-C  sulfamethoxazole-trimethoprim (BACTRIM DS) 800-160 MG tablet Take 1 tablet by mouth 2 (two) times daily for 7 days. 06/09/24 06/16/24  Darral Longs, MD    Allergies: Paxil [paroxetine] and Morphine  and codeine    Review of Systems  Cardiovascular:  Positive for chest pain.    Updated Vital Signs BP 105/84   Pulse 74   Temp 98.5 F  (36.9 C) (Oral)   Resp 16   Wt 88.9 kg   LMP 03/20/2013   SpO2 98%   BMI 33.64 kg/m   Physical Exam Vitals and nursing note reviewed.  Constitutional:      General: She is awake. She is not in acute distress.    Appearance: Normal appearance. She is well-developed. She is not ill-appearing, toxic-appearing or diaphoretic.  HENT:     Head: Normocephalic and atraumatic.  Eyes:     General: No scleral icterus. Cardiovascular:     Rate and Rhythm: Normal rate and regular rhythm.  Pulmonary:     Effort: Pulmonary effort is normal. No tachypnea, accessory muscle usage or respiratory distress.     Breath sounds: Normal breath sounds. No stridor. No decreased breath sounds, wheezing, rhonchi or rales.  Chest:     Chest wall: Tenderness (Sternal and left sided chest tenderness to palpation) present.  Genitourinary:    Comments: Patient declined exam of groin area where abscesses are located, stating she has good follow-up with her PCP on the 23rd of this month Musculoskeletal:        General: Normal range of motion.     Right lower leg: No tenderness. No edema.     Left lower leg: No tenderness. No edema.     Comments: No calf tenderness, redness, or swelling bilaterally  Grossly normal ROM of all 4 extremities  Skin:    General: Skin is warm.     Capillary Refill: Capillary refill takes less than 2 seconds.  Neurological:     General: No focal deficit present.  Mental Status: She is alert and oriented to person, place, and time.  Psychiatric:        Mood and Affect: Mood normal.        Behavior: Behavior normal. Behavior is cooperative.     (all labs ordered are listed, but only abnormal results are displayed) Labs Reviewed  COMPREHENSIVE METABOLIC PANEL WITH GFR - Abnormal; Notable for the following components:      Result Value   Glucose, Bld 115 (*)    All other components within normal limits  URINALYSIS, ROUTINE W REFLEX MICROSCOPIC - Abnormal; Notable for the  following components:   Color, Urine COLORLESS (*)    Specific Gravity, Urine 1.002 (*)    Leukocytes,Ua TRACE (*)    All other components within normal limits  CBC  D-DIMER, QUANTITATIVE  TROPONIN I (HIGH SENSITIVITY)    EKG: EKG Interpretation Date/Time:  Thursday June 15 2024 05:52:28 EDT Ventricular Rate:  85 PR Interval:  150 QRS Duration:  78 QT Interval:  366 QTC Calculation: 435 R Axis:   97  Text Interpretation: Normal sinus rhythm Rightward axis Nonspecific T wave abnormality Abnormal ECG When compared with ECG of 09-May-2016 01:56, No significant change since last tracing Confirmed by Patsey Lot [phone]) on 06/15/2024 8:31:33 AM  Radiology: [facility] Chest 2 View Result Date: 06/15/2024 EXAM: 2 VIEW(S) XRAY OF THE CHEST 06/15/2024 06:24:43 AM COMPARISON: PA and lateral radiographs of chest dated 05/09/2016. CLINICAL HISTORY: cp. Chest pain FINDINGS: LUNGS AND PLEURA: No focal pulmonary opacity. No pulmonary edema. No pleural effusion. No pneumothorax. HEART AND MEDIASTINUM: No acute abnormality of the cardiac and mediastinal silhouettes. BONES AND SOFT TISSUES: No acute osseous abnormality. IMPRESSION: 1. No acute cardiopulmonary pathology. Electronically signed by: Evalene Coho MD 06/15/2024 06:29 AM EDT RP Workstation: [id number]     Procedures   Medications Ordered in the ED  famotidine (PEPCID) tablet 20 mg (20 mg Oral Given 06/15/24 0728)  alum & mag hydroxide-simeth (MAALOX/MYLANTA) 200-200-20 MG/5ML suspension 30 mL (30 mLs Oral Given 06/15/24 0728)    And  lidocaine (XYLOCAINE) 2 % viscous mouth solution 15 mL (15 mLs Oral Given 06/15/24 0728)  ketorolac  (TORADOL ) 15 MG/ML injection 15 mg (15 mg Intravenous Given 06/15/24 0734)                                    Medical Decision Making Amount and/or Complexity of Data Reviewed Labs: ordered. Radiology: ordered.  Risk OTC drugs. Prescription drug management.   Patient presents to the ED for  concern of chest pain, this involves an extensive number of treatment options, and is a complaint that carries with it a high risk of complications and morbidity.  The differential diagnosis includes ACS, pneumothorax, PE, GI etiology, musculoskeletal pain, etc.   Co morbidities that complicate the patient evaluation  Hyperlipidemia currently on no medications   Additional history obtained:  Additional history obtained from urgent care note on 06/09/2024 where patient was treated for possible groin abscesses with 7-day course of Bactrim   Lab Tests:  I Ordered, and personally interpreted labs.  The pertinent results include: CBC reassuring, CMP reassuring, troponin 3, D-dimer not elevated, urinalysis not consistent with infection   Imaging Studies ordered:  I ordered imaging studies including chest x-ray I independently visualized and interpreted imaging which showed no acute cardiopulmonary abnormality I agree with the radiologist interpretation   Cardiac Monitoring:  The patient was maintained on a  cardiac monitor.  I personally viewed and interpreted the cardiac monitored which showed an underlying rhythm of: Sinus rhythm   Medicines ordered and prescription drug management:  I ordered medication including Toradol  for musculoskeletal pain, Pepcid and Maalox for possible GI etiology Reevaluation of the patient after these medicines showed that the patient improved I have reviewed the patients home medicines and have made adjustments as needed   Test Considered:  None   Critical Interventions:  None   Problem List / ED Course:  53 year old female stable, presents emergency department for chest pain x 3 days that is episodic, recently started course of Bactrim for groin abscesses, denies fever/chills or other infectious symptoms On physical exam central and left-sided chest tenderness with palpation, no obvious abnormality with auscultation of heart or lungs, patient  has grossly normal range of motion of all 4 extremities with no appreciated weakness Chest pain workup very reassuring, troponin of 3, I do not see an indication for repeat troponin as patient states that chest pain has been episodic for approximately 3 days, I would expect to see elevation by now if cardiac injury was occurring, CBC and CMP reassuring as well, urinalysis not consistent with infection Due to patient stating that she has increased pain with deep inspiration will obtain D-dimer test however very low clinical suspicion as patient has no risk factors or previous PEs or DVTs, patient also denies shortness of breath Will symptomatically treat with Toradol  due to chest wall tenderness as well as GI cocktail and reassess D-dimer not elevated, patient much improved after Toradol  injection as well as GI cocktail Educated on workup today and findings, I see no reason to keep the patient in the hospital at this time, patient stable for outpatient follow-up, heart score of 3 which puts her in the low risk category Patient will follow-up with PCP on 06/22/2024, declined GU exam today for abscesses in groin area stating that they are healing appropriately, will follow-up with PCP Return precautions given Patient discharged Most likely diagnosis at this time is atypical chest pain, no indication of cardiac etiology at this time, suspicious that chest pain may be of GI etiology due to patient starting a recent antibiotic or musculoskeletal etiology, improved with symptomatic treatment   Reevaluation:  After the interventions noted above, I reevaluated the patient and found that they have :improved   Social Determinants of Health:  none   Dispostion:  After consideration of the diagnostic results and the patients response to treatment, I feel that the patent would benefit from discharge and outpatient therapy as prescribed, follow-up with PCP.       Final diagnoses:  Chest pain,  unspecified type    ED Discharge Orders          Ordered    famotidine (PEPCID) 20 MG tablet  2 times daily        06/15/24 0829               Deaira Leckey F, PA-C 06/15/24 1847    Patsey Lot, MD 06/16/24 [phone]

## 2024-06-15 NOTE — Discharge Instructions (Addendum)
 It was a pleasure taking care of you today.  Based on your history, physical exam, labs, and imaging I feel you are safe for discharge.  Today your workup from a cardiac standpoint was very reassuring.  Your troponin was not elevated, and it does not appear you have had a heart attack.  Your symptoms did improve with anti-inflammatory medication as well as a GI cocktail.  Because of this I have sent in a prescription to your pharmacy for a reflux medicine.  Please pick it up and take as prescribed, and follow-up with your primary care provider as scheduled on 06/22/2024, please make them aware of your visit and workup today including all findings.  Suspicious that your symptoms may be coming from the GI effect of the antibiotic medication that you are currently taking or a musculoskeletal etiology.  Please complete the entire course of your antibiotic as prescribed.  If you experience any of the following symptoms including but not limited to fever, chills, chest pain, shortness of breath, unexplained weakness, please return to the emergency department or seek further medical care.  If symptoms persist or worsen recommend follow-up within 48 hours.

## 2024-06-15 NOTE — ED Triage Notes (Signed)
 Pt in with constant L chest pressure x 3 days, but pain has developed more into gas-like or feels like acid reflux through the night. Pt also c/o bilateral arm tingling. Pt recently seen at [facility] on 10/10 for abscesses to bilateral groin area, was started on Bactrim and is almost finished with the abx course.
